# Patient Record
Sex: Male | Born: 1955
Health system: Southern US, Community
[De-identification: ages and names within clinical notes are randomized; demographics above are authoritative.]

## PROBLEM LIST (undated history)

## (undated) DIAGNOSIS — M1990 Unspecified osteoarthritis, unspecified site: Secondary | ICD-10-CM

## (undated) DIAGNOSIS — J189 Pneumonia, unspecified organism: Secondary | ICD-10-CM

## (undated) DIAGNOSIS — S3991XA Unspecified injury of abdomen, initial encounter: Secondary | ICD-10-CM

## (undated) DIAGNOSIS — E785 Hyperlipidemia, unspecified: Secondary | ICD-10-CM

## (undated) DIAGNOSIS — I10 Essential (primary) hypertension: Secondary | ICD-10-CM

## (undated) DIAGNOSIS — D126 Benign neoplasm of colon, unspecified: Secondary | ICD-10-CM

## (undated) DIAGNOSIS — F329 Major depressive disorder, single episode, unspecified: Secondary | ICD-10-CM

## (undated) DIAGNOSIS — F1410 Cocaine abuse, uncomplicated: Secondary | ICD-10-CM

## (undated) DIAGNOSIS — F32A Depression, unspecified: Secondary | ICD-10-CM

## (undated) HISTORY — DX: Benign neoplasm of colon, unspecified: D12.6

## (undated) HISTORY — DX: Hyperlipidemia, unspecified: E78.5

## (undated) HISTORY — DX: Essential (primary) hypertension: I10

## (undated) HISTORY — PX: BACK SURGERY: SHX140

---

## 2004-03-13 ENCOUNTER — Emergency Department (HOSPITAL_COMMUNITY): Admission: EM | Admit: 2004-03-13 | Discharge: 2004-03-13 | Payer: Self-pay | Admitting: Emergency Medicine

## 2004-03-14 ENCOUNTER — Emergency Department (HOSPITAL_COMMUNITY): Admission: EM | Admit: 2004-03-14 | Discharge: 2004-03-14 | Payer: Self-pay | Admitting: Emergency Medicine

## 2004-05-15 ENCOUNTER — Emergency Department (HOSPITAL_COMMUNITY): Admission: EM | Admit: 2004-05-15 | Discharge: 2004-05-15 | Payer: Self-pay | Admitting: Emergency Medicine

## 2005-02-07 ENCOUNTER — Ambulatory Visit: Payer: Self-pay | Admitting: Orthopedic Surgery

## 2005-04-15 ENCOUNTER — Ambulatory Visit: Payer: Self-pay | Admitting: Orthopedic Surgery

## 2005-04-23 ENCOUNTER — Ambulatory Visit (HOSPITAL_COMMUNITY): Admission: RE | Admit: 2005-04-23 | Discharge: 2005-04-23 | Payer: Self-pay | Admitting: Orthopedic Surgery

## 2005-04-23 ENCOUNTER — Encounter: Payer: Self-pay | Admitting: Orthopedic Surgery

## 2005-04-23 ENCOUNTER — Ambulatory Visit: Payer: Self-pay | Admitting: Orthopedic Surgery

## 2005-04-29 ENCOUNTER — Ambulatory Visit: Payer: Self-pay | Admitting: Orthopedic Surgery

## 2005-05-08 ENCOUNTER — Ambulatory Visit: Payer: Self-pay | Admitting: Orthopedic Surgery

## 2005-05-09 ENCOUNTER — Emergency Department (HOSPITAL_COMMUNITY): Admission: EM | Admit: 2005-05-09 | Discharge: 2005-05-10 | Payer: Self-pay | Admitting: Emergency Medicine

## 2005-05-11 ENCOUNTER — Emergency Department (HOSPITAL_COMMUNITY): Admission: EM | Admit: 2005-05-11 | Discharge: 2005-05-11 | Payer: Self-pay | Admitting: Emergency Medicine

## 2005-05-15 ENCOUNTER — Encounter (HOSPITAL_COMMUNITY): Admission: RE | Admit: 2005-05-15 | Discharge: 2005-06-14 | Payer: Self-pay | Admitting: Orthopedic Surgery

## 2005-05-15 ENCOUNTER — Ambulatory Visit: Payer: Self-pay | Admitting: Orthopedic Surgery

## 2005-05-29 ENCOUNTER — Ambulatory Visit: Payer: Self-pay | Admitting: Orthopedic Surgery

## 2005-08-14 ENCOUNTER — Emergency Department (HOSPITAL_COMMUNITY): Admission: EM | Admit: 2005-08-14 | Discharge: 2005-08-14 | Payer: Self-pay | Admitting: Emergency Medicine

## 2005-10-19 ENCOUNTER — Emergency Department (HOSPITAL_COMMUNITY): Admission: EM | Admit: 2005-10-19 | Discharge: 2005-10-20 | Payer: Self-pay | Admitting: Emergency Medicine

## 2006-05-19 ENCOUNTER — Ambulatory Visit: Payer: Self-pay | Admitting: Orthopedic Surgery

## 2006-10-04 ENCOUNTER — Emergency Department (HOSPITAL_COMMUNITY): Admission: EM | Admit: 2006-10-04 | Discharge: 2006-10-04 | Payer: Self-pay | Admitting: Emergency Medicine

## 2008-02-11 ENCOUNTER — Emergency Department (HOSPITAL_COMMUNITY): Admission: EM | Admit: 2008-02-11 | Discharge: 2008-02-11 | Payer: Self-pay | Admitting: Emergency Medicine

## 2009-02-18 ENCOUNTER — Emergency Department (HOSPITAL_COMMUNITY): Admission: EM | Admit: 2009-02-18 | Discharge: 2009-02-18 | Payer: Self-pay | Admitting: Emergency Medicine

## 2009-06-12 ENCOUNTER — Emergency Department (HOSPITAL_COMMUNITY): Admission: EM | Admit: 2009-06-12 | Discharge: 2009-06-13 | Payer: Self-pay | Admitting: Emergency Medicine

## 2009-11-08 ENCOUNTER — Emergency Department (HOSPITAL_COMMUNITY): Admission: EM | Admit: 2009-11-08 | Discharge: 2009-11-08 | Payer: Self-pay | Admitting: Emergency Medicine

## 2010-02-20 ENCOUNTER — Inpatient Hospital Stay (HOSPITAL_COMMUNITY): Admission: EM | Admit: 2010-02-20 | Discharge: 2010-02-21 | Payer: Self-pay | Admitting: Emergency Medicine

## 2010-06-27 ENCOUNTER — Emergency Department (HOSPITAL_COMMUNITY)
Admission: EM | Admit: 2010-06-27 | Discharge: 2010-06-27 | Payer: Self-pay | Source: Home / Self Care | Admitting: Emergency Medicine

## 2010-06-27 ENCOUNTER — Ambulatory Visit: Payer: Self-pay | Admitting: Psychiatry

## 2010-06-27 ENCOUNTER — Inpatient Hospital Stay (HOSPITAL_COMMUNITY): Admission: EM | Admit: 2010-06-27 | Discharge: 2010-07-02 | Payer: Self-pay | Admitting: Psychiatry

## 2010-07-07 ENCOUNTER — Emergency Department (HOSPITAL_COMMUNITY): Admission: EM | Admit: 2010-07-07 | Discharge: 2010-07-07 | Payer: Self-pay | Admitting: Emergency Medicine

## 2010-12-08 ENCOUNTER — Emergency Department (HOSPITAL_COMMUNITY)
Admission: EM | Admit: 2010-12-08 | Discharge: 2010-12-08 | Disposition: A | Discharge status: Home / Self Care | Payer: Self-pay | Attending: Emergency Medicine | Admitting: Emergency Medicine

## 2010-12-08 DIAGNOSIS — X58XXXA Exposure to other specified factors, initial encounter: Secondary | ICD-10-CM | POA: Insufficient documentation

## 2010-12-08 DIAGNOSIS — S60459A Superficial foreign body of unspecified finger, initial encounter: Secondary | ICD-10-CM | POA: Insufficient documentation

## 2010-12-08 DIAGNOSIS — Y92009 Unspecified place in unspecified non-institutional (private) residence as the place of occurrence of the external cause: Secondary | ICD-10-CM | POA: Insufficient documentation

## 2010-12-20 LAB — CBC
HCT: 45.7 % (ref 39.0–52.0)
Hemoglobin: 15.4 g/dL (ref 13.0–17.0)
MCH: 30.7 pg (ref 26.0–34.0)
MCHC: 33.7 g/dL (ref 30.0–36.0)
MCV: 90.9 fL (ref 78.0–100.0)
Platelets: 244 10*3/uL (ref 150–400)
RBC: 5.02 MIL/uL (ref 4.22–5.81)
RDW: 13 % (ref 11.5–15.5)
WBC: 11.3 10*3/uL — ABNORMAL HIGH (ref 4.0–10.5)

## 2010-12-20 LAB — COMPREHENSIVE METABOLIC PANEL
ALT: 17 U/L (ref 0–53)
AST: 21 U/L (ref 0–37)
Albumin: 4.4 g/dL (ref 3.5–5.2)
Alkaline Phosphatase: 44 U/L (ref 39–117)
BUN: 8 mg/dL (ref 6–23)
CO2: 25 mEq/L (ref 19–32)
Calcium: 9.4 mg/dL (ref 8.4–10.5)
Chloride: 100 mEq/L (ref 96–112)
Creatinine, Ser: 0.78 mg/dL (ref 0.4–1.5)
GFR calc Af Amer: >60 mL/min (ref >60)
GFR calc non Af Amer: >60 mL/min (ref >60)
Glucose, Bld: 114 mg/dL — ABNORMAL HIGH (ref 70–99)
Potassium: 3.2 mEq/L — ABNORMAL LOW (ref 3.5–5.1)
Sodium: 134 mEq/L — ABNORMAL LOW (ref 135–145)
Total Bilirubin: 0.6 mg/dL (ref 0.3–1.2)
Total Protein: 8.1 g/dL (ref 6.0–8.3)

## 2010-12-20 LAB — DIFFERENTIAL
Basophils Absolute: 0 10*3/uL (ref 0.0–0.1)
Basophils Relative: 0 % (ref 0–1)
Eosinophils Absolute: 0.2 10*3/uL (ref 0.0–0.7)
Eosinophils Relative: 2 % (ref 0–5)
Lymphocytes Relative: 22 % (ref 12–46)
Lymphs Abs: 2.4 10*3/uL (ref 0.7–4.0)
Monocytes Absolute: 0.6 10*3/uL (ref 0.1–1.0)
Monocytes Relative: 6 % (ref 3–12)
Neutro Abs: 7.9 10*3/uL — ABNORMAL HIGH (ref 1.7–7.7)
Neutrophils Relative %: 71 % (ref 43–77)

## 2010-12-20 LAB — RAPID URINE DRUG SCREEN, HOSP PERFORMED
Amphetamines: NOT DETECTED
Barbiturates: NOT DETECTED
Benzodiazepines: NOT DETECTED
Cocaine: NOT DETECTED
Opiates: NOT DETECTED
Tetrahydrocannabinol: POSITIVE — AB

## 2010-12-20 LAB — ETHANOL: Alcohol, Ethyl (B): <5 mg/dL (ref 0–10)

## 2010-12-24 LAB — RAPID URINE DRUG SCREEN, HOSP PERFORMED
Amphetamines: NOT DETECTED
Barbiturates: NOT DETECTED
Benzodiazepines: POSITIVE — AB
Cocaine: NOT DETECTED
Opiates: POSITIVE — AB
Tetrahydrocannabinol: POSITIVE — AB

## 2010-12-24 LAB — BASIC METABOLIC PANEL
BUN: 2 mg/dL — ABNORMAL LOW (ref 6–23)
BUN: 3 mg/dL — ABNORMAL LOW (ref 6–23)
CO2: 26 mEq/L (ref 19–32)
CO2: 26 mEq/L (ref 19–32)
Calcium: 8.7 mg/dL (ref 8.4–10.5)
Calcium: 9.1 mg/dL (ref 8.4–10.5)
Chloride: 105 mEq/L (ref 96–112)
Chloride: 107 mEq/L (ref 96–112)
Creatinine, Ser: 0.79 mg/dL (ref 0.4–1.5)
Creatinine, Ser: 0.84 mg/dL (ref 0.4–1.5)
GFR calc Af Amer: >60 mL/min (ref >60)
GFR calc Af Amer: >60 mL/min (ref >60)
GFR calc non Af Amer: >60 mL/min (ref >60)
Glucose, Bld: 73 mg/dL (ref 70–99)
Potassium: 3 mEq/L — ABNORMAL LOW (ref 3.5–5.1)
Sodium: 139 mEq/L (ref 135–145)

## 2010-12-24 LAB — DIFFERENTIAL
Basophils Absolute: 0.1 10*3/uL (ref 0.0–0.1)
Basophils Absolute: 0.1 10*3/uL (ref 0.0–0.1)
Basophils Relative: 1 % (ref 0–1)
Basophils Relative: 1 % (ref 0–1)
Eosinophils Absolute: 0.6 10*3/uL (ref 0.0–0.7)
Eosinophils Absolute: 0.7 10*3/uL (ref 0.0–0.7)
Eosinophils Relative: 7 % — ABNORMAL HIGH (ref 0–5)
Lymphocytes Relative: 36 % (ref 12–46)
Lymphs Abs: 3.6 10*3/uL (ref 0.7–4.0)
Monocytes Absolute: 0.5 10*3/uL (ref 0.1–1.0)
Monocytes Relative: 5 % (ref 3–12)
Neutro Abs: 2.9 10*3/uL (ref 1.7–7.7)
Neutro Abs: 5.1 10*3/uL (ref 1.7–7.7)
Neutrophils Relative %: 38 % — ABNORMAL LOW (ref 43–77)
Neutrophils Relative %: 50 % (ref 43–77)

## 2010-12-24 LAB — CBC
HCT: 40.5 % (ref 39.0–52.0)
Hemoglobin: 14.3 g/dL (ref 13.0–17.0)
MCHC: 35.2 g/dL (ref 30.0–36.0)
MCHC: 35.4 g/dL (ref 30.0–36.0)
MCV: 87.4 fL (ref 78.0–100.0)
MCV: 87.6 fL (ref 78.0–100.0)
Platelets: 175 10*3/uL (ref 150–400)
Platelets: 179 10*3/uL (ref 150–400)
RBC: 4.62 MIL/uL (ref 4.22–5.81)
RDW: 13.5 % (ref 11.5–15.5)
RDW: 13.8 % (ref 11.5–15.5)
WBC: 10.1 10*3/uL (ref 4.0–10.5)

## 2010-12-24 LAB — SALICYLATE LEVEL: Salicylate Lvl: <4 mg/dL (ref 2.8–20.0)

## 2010-12-24 LAB — ETHANOL: Alcohol, Ethyl (B): <5 mg/dL (ref 0–10)

## 2010-12-24 LAB — ACETAMINOPHEN LEVEL: Acetaminophen (Tylenol), Serum: <10 ug/mL — ABNORMAL LOW (ref 10–30)

## 2010-12-24 LAB — BRAIN NATRIURETIC PEPTIDE: Pro B Natriuretic peptide (BNP): <30 pg/mL (ref 0.0–100.0)

## 2010-12-26 LAB — RAPID STREP SCREEN (MED CTR MEBANE ONLY): Streptococcus, Group A Screen (Direct): NEGATIVE

## 2011-01-11 LAB — DIFFERENTIAL
Basophils Absolute: 0.1 10*3/uL (ref 0.0–0.1)
Eosinophils Relative: 5 % (ref 0–5)
Lymphocytes Relative: 38 % (ref 12–46)
Neutrophils Relative %: 51 % (ref 43–77)

## 2011-01-11 LAB — POCT I-STAT, CHEM 8
BUN: 4 mg/dL — ABNORMAL LOW (ref 6–23)
Hemoglobin: 15.3 g/dL (ref 13.0–17.0)
Potassium: 3.7 mEq/L (ref 3.5–5.1)
Sodium: 139 mEq/L (ref 135–145)
TCO2: 26 mmol/L (ref 0–100)

## 2011-01-11 LAB — URINALYSIS, ROUTINE W REFLEX MICROSCOPIC
Glucose, UA: NEGATIVE mg/dL
Ketones, ur: NEGATIVE mg/dL
Nitrite: NEGATIVE
Protein, ur: NEGATIVE mg/dL
Urobilinogen, UA: 1 mg/dL (ref 0.0–1.0)

## 2011-01-11 LAB — CBC
HCT: 41.8 % (ref 39.0–52.0)
Platelets: 198 10*3/uL (ref 150–400)
RDW: 13 % (ref 11.5–15.5)

## 2011-02-22 NOTE — Op Note (Signed)
NAME:  [name], [name] NO.:  [id number]   MEDICAL RECORD NO.:  [id number]          PATIENT TYPE:  AMB   LOCATION:  DAY                           FACILITY:  [facility]   PHYSICIAN:  Vickki Hearing, M.D.DATE OF BIRTH:  05-Nov-1955   DATE OF PROCEDURE:  04/23/2005  DATE OF DISCHARGE:                                 OPERATIVE REPORT   PREOPERATIVE DIAGNOSIS:  Mass and bursitis right elbow.   POSTOPERATIVE DIAGNOSIS:  Right elbow bursitis.   PROCEDURE:  Bursectomy right elbow.   SURGEON:  Vickki Hearing, M.D.   ASSISTANT:  None.   ANESTHETIC:  General by LMA.   SPECIMENS:  Bursa.   BLOOD LOSS:  Minimal.   COMPLICATIONS:  None.   INDICATIONS:  Persistent pain and swelling over the right elbow.   HISTORY:  A 55 year old male status post aspiration of right elbow bursitis,  recurred, became more painful and presented for excision.   The patient was identified as Gary [name].  Right elbow was marked for  surgery, countersigned by the surgeon. History and physical was updated. He  was given Ancef IV, taken to the operating room for general LMA anesthetic.  After adequate anesthesia, his right arm was prepped and draped using  sterile technique with a tourniquet applied to the right upper extremity.  Time-out was taken and completed as required.  The limb was exsanguinated  with a 6-inch Esmarch.  Tourniquet was elevated 250 mmHg where it stayed for  20 minutes.   A curvilinear incision was made over the bursal tissue. The sac was removed  with blunt and sharp dissection. The wound was irrigated, closed the 2-0  Monocryl and staples. We injected 10 mL of 0.5% plain Marcaine, we applied a  sterile dressing, extubated the patient, took him to the recovery room in  stable condition. He will actually follow-up next week for staple removal.  He is discharged on Lorcet Plus for pain.       SEH/MEDQ  D:  04/23/2005  T:  04/23/2005  Job:  [id number]

## 2011-02-22 NOTE — H&P (Signed)
NAME:  GALDINO, HINCHMAN NO.:  [id number]   MEDICAL RECORD NO.:  [id number]          PATIENT TYPE:  AMB   LOCATION:  DAY                           FACILITY:  [facility]   PHYSICIAN:  Vickki Hearing, M.D.DATE OF BIRTH:  1955/12/28   DATE OF ADMISSION:  DATE OF DISCHARGE:  LH                                HISTORY & PHYSICAL   CHIEF COMPLAINT:  Mass, right elbow.   HISTORY OF PRESENT ILLNESS:  This is a 55 year old male with 3 months'  duration of aching pain in his right elbow associated with a development of  a mass.  He describes moderate aching with no history of trauma.  Pain is  constant.  No other associated signs or symptoms.  Modifying factors include  increase pain with motion and working.   REVIEW OF SYSTEMS:  All 10 systems reviewed, according to the patient, were  normal.   MEDICATIONS:  None.   ALLERGIES:  No known drug allergies.   PAST MEDICAL HISTORY:  None.   PAST SURGICAL HISTORY:  Previous back surgery.  Pharmacy [facility].   FAMILY HISTORY:  Heart disease, kidney disease, arthritis.   SOCIAL HISTORY:  He is married.  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  He reports a smoking history.  No alcohol.  Drinks a lot of coffee and tea.   PHYSICAL EXAMINATION:  VITAL SIGNS:  Weight 180, pulse 70, respirations 16.  GENERAL:  Appearance well-developed, well-nourished.  Hygiene and grooming  normal.  No deformities.  Body habitus ectomorphic.  CARDIAC:  Normal.  LYMPHS:  Negative in the epitrochlear region.  MUSCULOSKELETAL:  Normal gait and station.  Right elbow had normal range of  motion.  Muscle strength and tone were normal there.  There was no  instability.  He had a large, right elbow bursal sac which was tender.  It  was not red or showed signs of infection.  His skin was normal over the  area.  NEUROLOGIC:  Normal sensation.  Mood was good.  He was alert and oriented  x3.   LABORATORY DATA AND X-RAY  FINDINGS:  Radiographs included two views of his  right elbow.  They were normal.  We aspirated and injected it back in May.  It returned.  He wished to have it excised and we will do that.   PLAN:  Plan is for right elbow bursectomy for right elbow bursitis.       SEH/MEDQ  D:  04/22/2005  T:  04/22/2005  Job:  [id number]

## 2011-04-07 HISTORY — PX: STOMACH SURGERY: SHX791

## 2011-04-30 ENCOUNTER — Emergency Department (HOSPITAL_COMMUNITY): Payer: Medicaid Other

## 2011-04-30 ENCOUNTER — Inpatient Hospital Stay (HOSPITAL_COMMUNITY)
Admission: EM | Admit: 2011-04-30 | Discharge: 2011-05-16 | DRG: 957 | Disposition: A | Discharge status: Other Acute Inpatient Hospital | Payer: Medicaid Other | Source: Ambulatory Visit | Attending: General Surgery | Admitting: General Surgery

## 2011-04-30 DIAGNOSIS — J189 Pneumonia, unspecified organism: Secondary | ICD-10-CM | POA: Diagnosis not present

## 2011-04-30 DIAGNOSIS — K560 Paralytic ileus: Secondary | ICD-10-CM | POA: Diagnosis not present

## 2011-04-30 DIAGNOSIS — S36899A Unspecified injury of other intra-abdominal organs, initial encounter: Secondary | ICD-10-CM | POA: Diagnosis present

## 2011-04-30 DIAGNOSIS — F329 Major depressive disorder, single episode, unspecified: Secondary | ICD-10-CM | POA: Diagnosis present

## 2011-04-30 DIAGNOSIS — F1020 Alcohol dependence, uncomplicated: Secondary | ICD-10-CM | POA: Diagnosis present

## 2011-04-30 DIAGNOSIS — S3630XA Unspecified injury of stomach, initial encounter: Principal | ICD-10-CM | POA: Diagnosis present

## 2011-04-30 DIAGNOSIS — F17200 Nicotine dependence, unspecified, uncomplicated: Secondary | ICD-10-CM | POA: Diagnosis present

## 2011-04-30 DIAGNOSIS — S37009A Unspecified injury of unspecified kidney, initial encounter: Secondary | ICD-10-CM | POA: Diagnosis present

## 2011-04-30 DIAGNOSIS — S27809A Unspecified injury of diaphragm, initial encounter: Secondary | ICD-10-CM | POA: Diagnosis present

## 2011-04-30 DIAGNOSIS — F3289 Other specified depressive episodes: Secondary | ICD-10-CM | POA: Diagnosis present

## 2011-04-30 DIAGNOSIS — F10939 Alcohol use, unspecified with withdrawal, unspecified: Secondary | ICD-10-CM | POA: Diagnosis not present

## 2011-04-30 DIAGNOSIS — D62 Acute posthemorrhagic anemia: Secondary | ICD-10-CM | POA: Diagnosis not present

## 2011-04-30 DIAGNOSIS — F10239 Alcohol dependence with withdrawal, unspecified: Secondary | ICD-10-CM | POA: Diagnosis not present

## 2011-04-30 DIAGNOSIS — S36209A Unspecified injury of unspecified part of pancreas, initial encounter: Secondary | ICD-10-CM | POA: Diagnosis present

## 2011-04-30 DIAGNOSIS — B9689 Other specified bacterial agents as the cause of diseases classified elsewhere: Secondary | ICD-10-CM | POA: Diagnosis present

## 2011-04-30 DIAGNOSIS — X749XXA Intentional self-harm by unspecified firearm discharge, initial encounter: Secondary | ICD-10-CM | POA: Diagnosis present

## 2011-04-30 DIAGNOSIS — S36113A Laceration of liver, unspecified degree, initial encounter: Secondary | ICD-10-CM | POA: Diagnosis present

## 2011-04-30 DIAGNOSIS — S36119A Unspecified injury of liver, initial encounter: Secondary | ICD-10-CM

## 2011-04-30 DIAGNOSIS — E876 Hypokalemia: Secondary | ICD-10-CM | POA: Diagnosis not present

## 2011-04-30 LAB — BASIC METABOLIC PANEL
BUN: 10 mg/dL (ref 6–23)
CO2: 23 mEq/L (ref 19–32)
Calcium: 7.7 mg/dL — ABNORMAL LOW (ref 8.4–10.5)
GFR calc non Af Amer: >60 mL/min (ref >60)
Glucose, Bld: 208 mg/dL — ABNORMAL HIGH (ref 70–99)
Sodium: 134 mEq/L — ABNORMAL LOW (ref 135–145)

## 2011-04-30 LAB — POCT I-STAT, CHEM 8
Creatinine, Ser: 1 mg/dL (ref 0.50–1.35)
HCT: 41 % (ref 39.0–52.0)
Hemoglobin: 13.9 g/dL (ref 13.0–17.0)
Potassium: 3.3 meq/L — ABNORMAL LOW (ref 3.5–5.1)
Sodium: 135 meq/L (ref 135–145)
TCO2: 20 mmol/L (ref 0–100)

## 2011-04-30 LAB — ABO/RH: ABO/RH(D): A NEG

## 2011-04-30 LAB — PROTIME-INR
INR: 1.06 (ref 0.00–1.49)
Prothrombin Time: 14 s (ref 11.6–15.2)

## 2011-04-30 LAB — COMPREHENSIVE METABOLIC PANEL
ALT: 62 U/L — ABNORMAL HIGH (ref 0–53)
AST: 62 U/L — ABNORMAL HIGH (ref 0–37)
Albumin: 3.2 g/dL — ABNORMAL LOW (ref 3.5–5.2)
Calcium: 8.8 mg/dL (ref 8.4–10.5)
Creatinine, Ser: 1 mg/dL (ref 0.50–1.35)
GFR calc non Af Amer: >60 mL/min (ref >60)
Sodium: 136 meq/L (ref 135–145)
Total Protein: 6.7 g/dL (ref 6.0–8.3)

## 2011-04-30 LAB — MRSA PCR SCREENING: MRSA by PCR: NEGATIVE

## 2011-04-30 LAB — CBC
HCT: 34.4 % — ABNORMAL LOW (ref 39.0–52.0)
Hemoglobin: 13.8 g/dL (ref 13.0–17.0)
Hemoglobin: 11.9 g/dL — ABNORMAL LOW (ref 13.0–17.0)
MCH: 30.5 pg (ref 26.0–34.0)
MCH: 30.3 pg (ref 26.0–34.0)
MCHC: 34.6 g/dL (ref 30.0–36.0)
Platelets: 233 10*3/uL (ref 150–400)
RBC: 4.52 MIL/uL (ref 4.22–5.81)
RBC: 3.93 MIL/uL — ABNORMAL LOW (ref 4.22–5.81)
WBC: 16.9 10*3/uL — ABNORMAL HIGH (ref 4.0–10.5)

## 2011-04-30 LAB — PHOSPHORUS: Phosphorus: 3 mg/dL (ref 2.3–4.6)

## 2011-04-30 LAB — LACTIC ACID, PLASMA: Lactic Acid, Venous: 3.6 mmol/L — ABNORMAL HIGH (ref 0.5–2.2)

## 2011-05-01 ENCOUNTER — Inpatient Hospital Stay (HOSPITAL_COMMUNITY): Payer: Medicaid Other

## 2011-05-01 LAB — BASIC METABOLIC PANEL
CO2: 25 mEq/L (ref 19–32)
Calcium: 7.9 mg/dL — ABNORMAL LOW (ref 8.4–10.5)
Chloride: 100 mEq/L (ref 96–112)
GFR calc non Af Amer: >60 mL/min (ref >60)
Potassium: 4.5 mEq/L (ref 3.5–5.1)

## 2011-05-01 LAB — CBC
MCH: 29.4 pg (ref 26.0–34.0)
MCHC: 33.6 g/dL (ref 30.0–36.0)
Platelets: 171 10*3/uL (ref 150–400)
RBC: 3.84 MIL/uL — ABNORMAL LOW (ref 4.22–5.81)
RDW: 13.3 % (ref 11.5–15.5)

## 2011-05-01 LAB — POCT I-STAT 7, (LYTES, BLD GAS, ICA,H+H)
Calcium, Ion: 1.08 mmol/L — ABNORMAL LOW (ref 1.12–1.32)
HCT: 34 % — ABNORMAL LOW (ref 39.0–52.0)
Hemoglobin: 11.6 g/dL — ABNORMAL LOW (ref 13.0–17.0)
Potassium: 3.9 meq/L (ref 3.5–5.1)
Sodium: 136 meq/L (ref 135–145)
pH, Arterial: 7.361 (ref 7.350–7.450)

## 2011-05-01 MED ORDER — IOHEXOL 300 MG/ML  SOLN
80.0000 mL | Freq: Once | INTRAMUSCULAR | Status: AC | PRN
  Administered 2011-05-01: 80 mL via INTRAVENOUS

## 2011-05-02 DIAGNOSIS — R17 Unspecified jaundice: Secondary | ICD-10-CM

## 2011-05-02 LAB — CBC
HCT: 29.5 % — ABNORMAL LOW (ref 39.0–52.0)
MCH: 29.7 pg (ref 26.0–34.0)
MCV: 86.8 fL (ref 78.0–100.0)
Platelets: 154 10*3/uL (ref 150–400)
RDW: 13.6 % (ref 11.5–15.5)

## 2011-05-03 DIAGNOSIS — F1994 Other psychoactive substance use, unspecified with psychoactive substance-induced mood disorder: Secondary | ICD-10-CM

## 2011-05-03 LAB — CBC
MCH: 29.6 pg (ref 26.0–34.0)
MCHC: 33.9 g/dL (ref 30.0–36.0)
RDW: 13.9 % (ref 11.5–15.5)

## 2011-05-03 LAB — BASIC METABOLIC PANEL
BUN: 5 mg/dL — ABNORMAL LOW (ref 6–23)
Calcium: 8.6 mg/dL (ref 8.4–10.5)
Creatinine, Ser: 0.57 mg/dL (ref 0.50–1.35)
GFR calc Af Amer: >60 mL/min (ref >60)

## 2011-05-03 LAB — HEPATIC FUNCTION PANEL
Albumin: 2.5 g/dL — ABNORMAL LOW (ref 3.5–5.2)
Indirect Bilirubin: 1.1 mg/dL — ABNORMAL HIGH (ref 0.3–0.9)
Total Bilirubin: 3.7 mg/dL — ABNORMAL HIGH (ref 0.3–1.2)
Total Protein: 6.1 g/dL (ref 6.0–8.3)

## 2011-05-04 LAB — CBC
HCT: 26.1 % — ABNORMAL LOW (ref 39.0–52.0)
MCHC: 34.5 g/dL (ref 30.0–36.0)
Platelets: 222 10*3/uL (ref 150–400)
RDW: 14 % (ref 11.5–15.5)
WBC: 10.3 10*3/uL (ref 4.0–10.5)

## 2011-05-04 LAB — TYPE AND SCREEN
ABO/RH(D): A NEG
Antibody Screen: NEGATIVE
Unit division: 0
Unit division: 0
Unit division: 0
Unit division: 0

## 2011-05-04 LAB — COMPREHENSIVE METABOLIC PANEL
AST: 24 U/L (ref 0–37)
Albumin: 2.4 g/dL — ABNORMAL LOW (ref 3.5–5.2)
Alkaline Phosphatase: 132 U/L — ABNORMAL HIGH (ref 39–117)
BUN: 7 mg/dL (ref 6–23)
Chloride: 99 mEq/L (ref 96–112)
Creatinine, Ser: 0.53 mg/dL (ref 0.50–1.35)
Potassium: 3.5 mEq/L (ref 3.5–5.1)
Total Bilirubin: 1.7 mg/dL — ABNORMAL HIGH (ref 0.3–1.2)
Total Protein: 6.2 g/dL (ref 6.0–8.3)

## 2011-05-05 LAB — CBC
HCT: 27.3 % — ABNORMAL LOW (ref 39.0–52.0)
MCH: 29.4 pg (ref 26.0–34.0)
MCV: 86.4 fL (ref 78.0–100.0)
Platelets: 260 10*3/uL (ref 150–400)
RBC: 3.16 MIL/uL — ABNORMAL LOW (ref 4.22–5.81)
WBC: 6.6 10*3/uL (ref 4.0–10.5)

## 2011-05-05 LAB — BASIC METABOLIC PANEL
BUN: 8 mg/dL (ref 6–23)
CO2: 26 mEq/L (ref 19–32)
Calcium: 8.8 mg/dL (ref 8.4–10.5)
Chloride: 98 mEq/L (ref 96–112)
Creatinine, Ser: 0.59 mg/dL (ref 0.50–1.35)
Glucose, Bld: 111 mg/dL — ABNORMAL HIGH (ref 70–99)
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
## 2011-05-06 DIAGNOSIS — D62 Acute posthemorrhagic anemia: Secondary | ICD-10-CM

## 2011-05-07 ENCOUNTER — Inpatient Hospital Stay (HOSPITAL_COMMUNITY): Payer: Medicaid Other

## 2011-05-07 DIAGNOSIS — J9600 Acute respiratory failure, unspecified whether with hypoxia or hypercapnia: Secondary | ICD-10-CM

## 2011-05-07 LAB — URINE MICROSCOPIC-ADD ON

## 2011-05-07 LAB — COMPREHENSIVE METABOLIC PANEL
ALT: 78 U/L — ABNORMAL HIGH (ref 0–53)
BUN: 6 mg/dL (ref 6–23)
CO2: 27 mEq/L (ref 19–32)
Calcium: 8.4 mg/dL (ref 8.4–10.5)
Creatinine, Ser: 0.48 mg/dL — ABNORMAL LOW (ref 0.50–1.35)
GFR calc Af Amer: >60 mL/min (ref >60)
GFR calc non Af Amer: >60 mL/min (ref >60)
Glucose, Bld: 98 mg/dL (ref 70–99)
Sodium: 137 mEq/L (ref 135–145)
Total Protein: 6.6 g/dL (ref 6.0–8.3)

## 2011-05-07 LAB — CBC
HCT: 26 % — ABNORMAL LOW (ref 39.0–52.0)
Hemoglobin: 8.9 g/dL — ABNORMAL LOW (ref 13.0–17.0)
MCH: 30 pg (ref 26.0–34.0)
MCHC: 34.2 g/dL (ref 30.0–36.0)
MCV: 87.5 fL (ref 78.0–100.0)
RBC: 2.97 MIL/uL — ABNORMAL LOW (ref 4.22–5.81)

## 2011-05-07 LAB — URINALYSIS, ROUTINE W REFLEX MICROSCOPIC
Bilirubin Urine: NEGATIVE
Ketones, ur: NEGATIVE mg/dL
Specific Gravity, Urine: 1.015 (ref 1.005–1.030)
Urobilinogen, UA: 0.2 mg/dL (ref 0.0–1.0)

## 2011-05-07 LAB — GLUCOSE, CAPILLARY: Glucose-Capillary: 93 mg/dL (ref 70–99)

## 2011-05-07 MED ORDER — SODIUM CHLORIDE 0.9 % IJ SOLN
INTRAMUSCULAR | Status: AC
  Filled 2011-05-07: qty 20

## 2011-05-08 ENCOUNTER — Inpatient Hospital Stay (HOSPITAL_COMMUNITY): Payer: Medicaid Other

## 2011-05-08 LAB — CBC
Hemoglobin: 8.4 g/dL — ABNORMAL LOW (ref 13.0–17.0)
MCV: 88.8 fL (ref 78.0–100.0)
Platelets: 385 10*3/uL (ref 150–400)
RBC: 2.78 MIL/uL — ABNORMAL LOW (ref 4.22–5.81)
WBC: 12.9 10*3/uL — ABNORMAL HIGH (ref 4.0–10.5)

## 2011-05-08 LAB — URINE CULTURE
Colony Count: NO GROWTH
Culture  Setup Time: 201207311900
Culture: NO GROWTH
Special Requests: NEGATIVE

## 2011-05-08 LAB — BASIC METABOLIC PANEL
CO2: 27 mEq/L (ref 19–32)
Chloride: 105 mEq/L (ref 96–112)
Sodium: 138 mEq/L (ref 135–145)

## 2011-05-08 LAB — POCT I-STAT 3, ART BLOOD GAS (G3+)
Bicarbonate: 27.7 meq/L — ABNORMAL HIGH (ref 20.0–24.0)
TCO2: 29 mmol/L (ref 0–100)
pCO2 arterial: 47.7 mmHg — ABNORMAL HIGH (ref 35.0–45.0)
pH, Arterial: 7.378 (ref 7.350–7.450)

## 2011-05-09 ENCOUNTER — Inpatient Hospital Stay (HOSPITAL_COMMUNITY): Payer: Medicaid Other

## 2011-05-09 LAB — BASIC METABOLIC PANEL
BUN: 8 mg/dL (ref 6–23)
Creatinine, Ser: 0.6 mg/dL (ref 0.50–1.35)
GFR calc Af Amer: >60 mL/min (ref >60)
GFR calc non Af Amer: >60 mL/min (ref >60)

## 2011-05-09 LAB — DIFFERENTIAL
Basophils Absolute: 0 10*3/uL (ref 0.0–0.1)
Basophils Relative: 0 % (ref 0–1)
Eosinophils Absolute: 0.8 10*3/uL — ABNORMAL HIGH (ref 0.0–0.7)
Monocytes Absolute: 0.8 10*3/uL (ref 0.1–1.0)
Neutro Abs: 8.5 10*3/uL — ABNORMAL HIGH (ref 1.7–7.7)
Neutrophils Relative %: 69 % (ref 43–77)

## 2011-05-09 LAB — CBC
HCT: 22.4 % — ABNORMAL LOW (ref 39.0–52.0)
MCHC: 33 g/dL (ref 30.0–36.0)
MCV: 88.2 fL (ref 78.0–100.0)
RDW: 13.7 % (ref 11.5–15.5)

## 2011-05-09 LAB — BLOOD GAS, ARTERIAL
Bicarbonate: 25.2 mEq/L — ABNORMAL HIGH (ref 20.0–24.0)
PEEP: 5 cmH2O
Patient temperature: 98.6
pH, Arterial: 7.454 — ABNORMAL HIGH (ref 7.350–7.450)
pO2, Arterial: 113 mmHg — ABNORMAL HIGH (ref 80.0–100.0)

## 2011-05-09 MED ORDER — IOHEXOL 300 MG/ML  SOLN
150.0000 mL | Freq: Once | INTRAMUSCULAR | Status: AC | PRN
  Administered 2011-05-09: 150 mL via ORAL

## 2011-05-10 ENCOUNTER — Inpatient Hospital Stay (HOSPITAL_COMMUNITY): Payer: Medicaid Other

## 2011-05-10 DIAGNOSIS — J189 Pneumonia, unspecified organism: Secondary | ICD-10-CM

## 2011-05-10 DIAGNOSIS — E876 Hypokalemia: Secondary | ICD-10-CM

## 2011-05-10 LAB — CULTURE, BAL-QUANTITATIVE W GRAM STAIN

## 2011-05-10 LAB — COMPREHENSIVE METABOLIC PANEL
AST: 36 U/L (ref 0–37)
Albumin: 2.3 g/dL — ABNORMAL LOW (ref 3.5–5.2)
Chloride: 106 mEq/L (ref 96–112)
Creatinine, Ser: 0.64 mg/dL (ref 0.50–1.35)
Total Bilirubin: 0.8 mg/dL (ref 0.3–1.2)

## 2011-05-10 LAB — CBC
HCT: 25.9 % — ABNORMAL LOW (ref 39.0–52.0)
Hemoglobin: 8.9 g/dL — ABNORMAL LOW (ref 13.0–17.0)
RDW: 13.9 % (ref 11.5–15.5)
WBC: 10.2 10*3/uL (ref 4.0–10.5)

## 2011-05-10 LAB — CROSSMATCH
ABO/RH(D): A NEG
Antibody Screen: NEGATIVE

## 2011-05-10 LAB — AMMONIA: Ammonia: 36 umol/L (ref 11–60)

## 2011-05-10 LAB — PROTIME-INR: INR: 1.4 (ref 0.00–1.49)

## 2011-05-11 LAB — CBC
Hemoglobin: 9.3 g/dL — ABNORMAL LOW (ref 13.0–17.0)
MCH: 29.5 pg (ref 26.0–34.0)
MCV: 87.3 fL (ref 78.0–100.0)
Platelets: 641 10*3/uL — ABNORMAL HIGH (ref 150–400)
RBC: 3.15 MIL/uL — ABNORMAL LOW (ref 4.22–5.81)

## 2011-05-11 LAB — BASIC METABOLIC PANEL
BUN: 5 mg/dL — ABNORMAL LOW (ref 6–23)
CO2: 25 mEq/L (ref 19–32)
Calcium: 8.7 mg/dL (ref 8.4–10.5)
Creatinine, Ser: 1.03 mg/dL (ref 0.50–1.35)
Glucose, Bld: 100 mg/dL — ABNORMAL HIGH (ref 70–99)
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
## 2011-05-14 DIAGNOSIS — F322 Major depressive disorder, single episode, severe without psychotic features: Secondary | ICD-10-CM

## 2011-05-16 ENCOUNTER — Inpatient Hospital Stay (HOSPITAL_COMMUNITY)
Admission: AD | Admit: 2011-05-16 | Discharge: 2011-05-20 | DRG: 896 | Disposition: A | Discharge status: Home / Self Care | Payer: 59 | Source: Ambulatory Visit | Attending: Psychiatry | Admitting: Psychiatry

## 2011-05-16 DIAGNOSIS — F17200 Nicotine dependence, unspecified, uncomplicated: Secondary | ICD-10-CM

## 2011-05-16 DIAGNOSIS — F329 Major depressive disorder, single episode, unspecified: Secondary | ICD-10-CM

## 2011-05-16 DIAGNOSIS — G8929 Other chronic pain: Secondary | ICD-10-CM

## 2011-05-16 DIAGNOSIS — F1320 Sedative, hypnotic or anxiolytic dependence, uncomplicated: Secondary | ICD-10-CM

## 2011-05-16 DIAGNOSIS — F1010 Alcohol abuse, uncomplicated: Secondary | ICD-10-CM

## 2011-05-16 DIAGNOSIS — M545 Low back pain, unspecified: Secondary | ICD-10-CM

## 2011-05-16 DIAGNOSIS — S36899A Unspecified injury of other intra-abdominal organs, initial encounter: Secondary | ICD-10-CM

## 2011-05-16 DIAGNOSIS — F1120 Opioid dependence, uncomplicated: Principal | ICD-10-CM

## 2011-05-16 DIAGNOSIS — X749XXA Intentional self-harm by unspecified firearm discharge, initial encounter: Secondary | ICD-10-CM

## 2011-05-16 DIAGNOSIS — F3289 Other specified depressive episodes: Secondary | ICD-10-CM

## 2011-05-16 DIAGNOSIS — F1994 Other psychoactive substance use, unspecified with psychoactive substance-induced mood disorder: Secondary | ICD-10-CM

## 2011-05-16 DIAGNOSIS — K219 Gastro-esophageal reflux disease without esophagitis: Secondary | ICD-10-CM

## 2011-05-17 DIAGNOSIS — F1910 Other psychoactive substance abuse, uncomplicated: Secondary | ICD-10-CM

## 2011-05-17 LAB — PROTIME-INR
INR: 1.08 (ref 0.00–1.49)
Prothrombin Time: 14.2 seconds (ref 11.6–15.2)
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
## 2011-05-27 ENCOUNTER — Telehealth (INDEPENDENT_AMBULATORY_CARE_PROVIDER_SITE_OTHER): Payer: Self-pay | Admitting: Orthopedic Surgery

## 2011-05-27 NOTE — Telephone Encounter (Signed)
Wife called to get appointment. Explained he already had one this Thursday at 2:00. She was given location.
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
## 2011-05-30 ENCOUNTER — Ambulatory Visit (INDEPENDENT_AMBULATORY_CARE_PROVIDER_SITE_OTHER): Payer: Self-pay | Admitting: Orthopedic Surgery

## 2011-05-30 DIAGNOSIS — S3630XA Unspecified injury of stomach, initial encounter: Secondary | ICD-10-CM

## 2011-05-30 DIAGNOSIS — F329 Major depressive disorder, single episode, unspecified: Secondary | ICD-10-CM | POA: Insufficient documentation

## 2011-05-30 DIAGNOSIS — S21309A Unspecified open wound of unspecified front wall of thorax with penetration into thoracic cavity, initial encounter: Secondary | ICD-10-CM

## 2011-05-30 NOTE — Patient Instructions (Signed)
No lifting more than 5 pounds until 9/10

## 2011-05-30 NOTE — Progress Notes (Signed)
Doing well. No N/V. Eating and elimination normal. Only compaint is dull pain in bilateral lower abdomen when he lays down at night. Lasts until he falls asleep for about 15-20 min with occasional sharp pain radiating to scrotum. Pain is not present when he wakes up.  PE:  Abd: Incision well healed and intact. Flat with normoactive bowel sounds. Mild TTP bilateral lower quadrants. No direct/indirect hernias appreciated.  A/P  S/p ex lap -- I suspect pain is related to stretching of lower abd musculature. This would make sense since it is absent when he wakes up. I reassured him but told him that if it didn't resolve within a month or if it got worse to let us know and we would likely check an x-ray and/or CT. Continue lifting restriction until 9/10.

## 2011-06-13 ENCOUNTER — Telehealth (INDEPENDENT_AMBULATORY_CARE_PROVIDER_SITE_OTHER): Payer: Self-pay | Admitting: Orthopedic Surgery

## 2011-06-13 MED ORDER — RANITIDINE HCL 75 MG PO TABS: 150.0000 mg | ORAL_TABLET | Freq: Every day | ORAL | Status: DC

## 2011-06-13 NOTE — Telephone Encounter (Signed)
Wife called regarding episodes of chest pain and vomiting. It happened this time and once last week. Complains of chest pain every morning. Was going to come to ED but then he started feeling better so didn't. Wife going to try and get in touch with him to have him call us.

## 2011-06-13 NOTE — Telephone Encounter (Signed)
Pt called in to say that he's having chest pain every morning about 4-5AM every morning. Then it gets better during the day. He always has nausea with it, occasionally will have brash in mouth, and twice (once last week and once this morning) has had vomiting. I suggested 150mg  Zantac at bedtime. He is to call Monday if things are not better or go to ED if things get worse.

## 2011-06-13 NOTE — Telephone Encounter (Signed)
Pt tried to call on cell phone that didn't have many minutes. May have run out. She is going to try and figure out a way for him to get in touch with [place].

## 2011-08-07 ENCOUNTER — Encounter: Payer: Self-pay | Admitting: Thoracic Surgery

## 2011-11-14 ENCOUNTER — Encounter (HOSPITAL_COMMUNITY): Payer: Self-pay | Admitting: Emergency Medicine

## 2011-11-14 ENCOUNTER — Encounter (HOSPITAL_COMMUNITY): Payer: Self-pay | Admitting: Anesthesiology

## 2011-11-14 ENCOUNTER — Emergency Department (HOSPITAL_COMMUNITY): Payer: Medicaid Other

## 2011-11-14 ENCOUNTER — Emergency Department (HOSPITAL_COMMUNITY)
Admission: EM | Admit: 2011-11-14 | Discharge: 2011-11-14 | Disposition: A | Discharge status: Home / Self Care | Payer: Medicaid Other | Attending: Emergency Medicine | Admitting: Emergency Medicine

## 2011-11-14 ENCOUNTER — Encounter (HOSPITAL_COMMUNITY)
Admission: EM | Disposition: A | Discharge status: Home / Self Care | Payer: Self-pay | Source: Home / Self Care | Attending: Emergency Medicine

## 2011-11-14 ENCOUNTER — Emergency Department (HOSPITAL_COMMUNITY): Payer: Medicaid Other | Admitting: Anesthesiology

## 2011-11-14 DIAGNOSIS — IMO0002 Reserved for concepts with insufficient information to code with codable children: Secondary | ICD-10-CM | POA: Insufficient documentation

## 2011-11-14 DIAGNOSIS — W298XXA Contact with other powered powered hand tools and household machinery, initial encounter: Secondary | ICD-10-CM | POA: Insufficient documentation

## 2011-11-14 DIAGNOSIS — Y998 Other external cause status: Secondary | ICD-10-CM | POA: Insufficient documentation

## 2011-11-14 DIAGNOSIS — F329 Major depressive disorder, single episode, unspecified: Secondary | ICD-10-CM | POA: Insufficient documentation

## 2011-11-14 DIAGNOSIS — Y92009 Unspecified place in unspecified non-institutional (private) residence as the place of occurrence of the external cause: Secondary | ICD-10-CM | POA: Insufficient documentation

## 2011-11-14 DIAGNOSIS — F17200 Nicotine dependence, unspecified, uncomplicated: Secondary | ICD-10-CM | POA: Insufficient documentation

## 2011-11-14 DIAGNOSIS — F3289 Other specified depressive episodes: Secondary | ICD-10-CM | POA: Insufficient documentation

## 2011-11-14 DIAGNOSIS — S61209A Unspecified open wound of unspecified finger without damage to nail, initial encounter: Secondary | ICD-10-CM | POA: Insufficient documentation

## 2011-11-14 DIAGNOSIS — S68119A Complete traumatic metacarpophalangeal amputation of unspecified finger, initial encounter: Secondary | ICD-10-CM

## 2011-11-14 HISTORY — DX: Unspecified injury of abdomen, initial encounter: S39.91XA

## 2011-11-14 HISTORY — PX: AMPUTATION: SHX166

## 2011-11-14 HISTORY — PX: LACERATION REPAIR: SHX5284

## 2011-11-14 LAB — POCT I-STAT, CHEM 8
Calcium, Ion: 1.18 mmol/L (ref 1.12–1.32)
HCT: 40 % (ref 39.0–52.0)
TCO2: 24 mmol/L (ref 0–100)

## 2011-11-14 LAB — DIFFERENTIAL
Basophils Absolute: 0.1 10*3/uL (ref 0.0–0.1)
Basophils Relative: 0 % (ref 0–1)
Eosinophils Absolute: 0.3 10*3/uL (ref 0.0–0.7)
Eosinophils Relative: 2 % (ref 0–5)
Monocytes Absolute: 0.7 10*3/uL (ref 0.1–1.0)

## 2011-11-14 LAB — CBC
HCT: 38.9 % — ABNORMAL LOW (ref 39.0–52.0)
MCHC: 33.4 g/dL (ref 30.0–36.0)
MCV: 87.4 fL (ref 78.0–100.0)
RDW: 13.2 % (ref 11.5–15.5)

## 2011-11-14 SURGERY — AMPUTATION DIGIT
Anesthesia: General | Laterality: Left | Wound class: Dirty or Infected

## 2011-11-14 MED ORDER — LIDOCAINE HCL 4 % MT SOLN
OROMUCOSAL | Status: DC | PRN
  Administered 2011-11-14: 4 mL via TOPICAL

## 2011-11-14 MED ORDER — HYDROMORPHONE HCL PF 1 MG/ML IJ SOLN
1.0000 mg | INTRAMUSCULAR | Status: DC | PRN
  Administered 2011-11-14: 1 mg via INTRAVENOUS
  Filled 2011-11-14 (×2): qty 1

## 2011-11-14 MED ORDER — CEFAZOLIN SODIUM 1-5 GM-% IV SOLN
INTRAVENOUS | Status: DC | PRN
  Administered 2011-11-14: 1 g via INTRAVENOUS

## 2011-11-14 MED ORDER — LACTATED RINGERS IV SOLN
INTRAVENOUS | Status: DC | PRN
  Administered 2011-11-14 (×2): via INTRAVENOUS

## 2011-11-14 MED ORDER — HYDROMORPHONE HCL PF 1 MG/ML IJ SOLN
1.0000 mg | Freq: Once | INTRAMUSCULAR | Status: AC
  Administered 2011-11-14: 1 mg via INTRAVENOUS

## 2011-11-14 MED ORDER — BUPIVACAINE HCL 0.25 % IJ SOLN
INTRAMUSCULAR | Status: DC | PRN
  Administered 2011-11-14: 10 mL

## 2011-11-14 MED ORDER — SUCCINYLCHOLINE CHLORIDE 20 MG/ML IJ SOLN
INTRAMUSCULAR | Status: DC | PRN
  Administered 2011-11-14: 100 mg via INTRAVENOUS

## 2011-11-14 MED ORDER — BACITRACIN ZINC 500 UNIT/GM EX OINT
TOPICAL_OINTMENT | CUTANEOUS | Status: DC | PRN
  Administered 2011-11-14: 1 via TOPICAL

## 2011-11-14 MED ORDER — CEFAZOLIN SODIUM 1 G IJ SOLR: 1.0000 g | Freq: Once | INTRAMUSCULAR | Status: DC

## 2011-11-14 MED ORDER — PROPOFOL 10 MG/ML IV EMUL
INTRAVENOUS | Status: DC | PRN
  Administered 2011-11-14: 180 mg via INTRAVENOUS

## 2011-11-14 MED ORDER — HYDROMORPHONE HCL PF 1 MG/ML IJ SOLN
0.2500 mg | INTRAMUSCULAR | Status: DC | PRN
  Administered 2011-11-14 (×3): 0.5 mg via INTRAVENOUS

## 2011-11-14 MED ORDER — MIDAZOLAM HCL 5 MG/5ML IJ SOLN
INTRAMUSCULAR | Status: DC | PRN
  Administered 2011-11-14: 2 mg via INTRAVENOUS

## 2011-11-14 MED ORDER — CEFAZOLIN SODIUM 1-5 GM-% IV SOLN
1.0000 g | Freq: Once | INTRAVENOUS | Status: AC
  Administered 2011-11-14: 1 g via INTRAVENOUS
  Filled 2011-11-14: qty 50

## 2011-11-14 MED ORDER — PROMETHAZINE HCL 25 MG/ML IJ SOLN: 6.2500 mg | INTRAMUSCULAR | Status: DC | PRN

## 2011-11-14 MED ORDER — MEPERIDINE HCL 25 MG/ML IJ SOLN: 6.2500 mg | INTRAMUSCULAR | Status: DC | PRN

## 2011-11-14 MED ORDER — HYDROMORPHONE HCL PF 1 MG/ML IJ SOLN
INTRAMUSCULAR | Status: AC
  Filled 2011-11-14: qty 1

## 2011-11-14 MED ORDER — FENTANYL CITRATE 0.05 MG/ML IJ SOLN
INTRAMUSCULAR | Status: DC | PRN
  Administered 2011-11-14 (×3): 50 ug via INTRAVENOUS
  Administered 2011-11-14: 100 ug via INTRAVENOUS

## 2011-11-14 SURGICAL SUPPLY — 38 items
BANDAGE CONFORM 3  STR LF (GAUZE/BANDAGES/DRESSINGS) ×2 IMPLANT
BANDAGE ELASTIC 3 VELCRO ST LF (GAUZE/BANDAGES/DRESSINGS) ×4 IMPLANT
BANDAGE ELASTIC 4 VELCRO ST LF (GAUZE/BANDAGES/DRESSINGS) IMPLANT
BANDAGE GAUZE ELAST BULKY 4 IN (GAUZE/BANDAGES/DRESSINGS) IMPLANT
BNDG CMPR 9X4 STRL LF SNTH (GAUZE/BANDAGES/DRESSINGS) ×1
BNDG ESMARK 4X9 LF (GAUZE/BANDAGES/DRESSINGS) ×2 IMPLANT
CHLORAPREP W/TINT 26ML (MISCELLANEOUS) IMPLANT
CORDS BIPOLAR (ELECTRODE) ×2 IMPLANT
DRSG ADAPTIC 3X8 NADH LF (GAUZE/BANDAGES/DRESSINGS) IMPLANT
ELECT REM PT RETURN 9FT ADLT (ELECTROSURGICAL)
ELECTRODE REM PT RTRN 9FT ADLT (ELECTROSURGICAL) IMPLANT
GAUZE XEROFORM 1X8 LF (GAUZE/BANDAGES/DRESSINGS) IMPLANT
GAUZE XEROFORM 5X9 LF (GAUZE/BANDAGES/DRESSINGS) ×2 IMPLANT
GLOVE BIO SURGEON STRL SZ 6.5 (GLOVE) ×6 IMPLANT
GLOVE BIOGEL M STRL SZ7.5 (GLOVE) ×2 IMPLANT
GLOVE BIOGEL PI ORTHO PRO SZ7 (GLOVE) ×1
GLOVE PI ORTHO PRO STRL SZ7 (GLOVE) ×1 IMPLANT
GOWN PREVENTION PLUS XLARGE (GOWN DISPOSABLE) ×2 IMPLANT
GOWN STRL NON-REIN LRG LVL3 (GOWN DISPOSABLE) ×2 IMPLANT
KIT BASIN OR (CUSTOM PROCEDURE TRAY) ×2 IMPLANT
KIT ROOM TURNOVER OR (KITS) ×2 IMPLANT
NS IRRIG 1000ML POUR BTL (IV SOLUTION) ×2 IMPLANT
PACK ORTHO EXTREMITY (CUSTOM PROCEDURE TRAY) ×2 IMPLANT
PAD ARMBOARD 7.5X6 YLW CONV (MISCELLANEOUS) ×4 IMPLANT
PAD CAST 4YDX4 CTTN HI CHSV (CAST SUPPLIES) IMPLANT
PADDING CAST COTTON 4X4 STRL (CAST SUPPLIES)
SOAP 2 % CHG 4 OZ (WOUND CARE) ×2 IMPLANT
SPONGE GAUZE 4X4 12PLY (GAUZE/BANDAGES/DRESSINGS) ×2 IMPLANT
SPONGE LAP 18X18 X RAY DECT (DISPOSABLE) IMPLANT
SPONGE LAP 4X18 X RAY DECT (DISPOSABLE) ×2 IMPLANT
SUT ETHILON 5 0 PS 2 18 (SUTURE) ×4 IMPLANT
SUT VIC AB 4-0 P-3 18X BRD (SUTURE) ×1 IMPLANT
SUT VIC AB 4-0 P3 18 (SUTURE) ×1
TOWEL OR 17X24 6PK STRL BLUE (TOWEL DISPOSABLE) ×2 IMPLANT
TOWEL OR 17X26 10 PK STRL BLUE (TOWEL DISPOSABLE) ×2 IMPLANT
TUBE CONNECTING 12X1/4 (SUCTIONS) ×2 IMPLANT
WATER STERILE IRR 1000ML POUR (IV SOLUTION) ×2 IMPLANT
YANKAUER SUCT BULB TIP NO VENT (SUCTIONS) ×2 IMPLANT

## 2011-11-14 NOTE — Transfer of Care (Signed)
Immediate Anesthesia Transfer of Care Note  Patient: Gary [name]  Procedure(s) Performed:  AMPUTATION DIGIT - Revision Amputation Left Middle Finger; REPAIR MULTIPLE LACERATIONS - Repair laceration Left Index Finger  Patient Location: PACU  Anesthesia Type: General  Level of Consciousness: awake, alert  and oriented  Airway & Oxygen Therapy: Patient Spontanous Breathing and Patient connected to nasal cannula oxygen  Post-op Assessment: Report given to PACU RN, Post -op Vital signs reviewed and stable, Post -op Vital signs reviewed and unstable, Anesthesiologist notified, Patient moving all extremities and Patient moving all extremities X 4  Post vital signs: Reviewed and stable  Complications: No apparent anesthesia complications

## 2011-11-14 NOTE — Anesthesia Postprocedure Evaluation (Signed)
  Anesthesia Post-op Note  Patient: Gary [name]  Procedure(s) Performed:  AMPUTATION DIGIT - Revision Amputation Left Middle Finger; REPAIR MULTIPLE LACERATIONS - Repair laceration Left Index Finger  Patient Location: PACU  Anesthesia Type: General  Level of Consciousness: awake  Airway and Oxygen Therapy: Patient Spontanous Breathing  Post-op Pain: mild  Post-op Assessment: Post-op Vital signs reviewed  Post-op Vital Signs: stable  Complications: No apparent anesthesia complications

## 2011-11-14 NOTE — ED Notes (Signed)
Pt undressed and into gown, family has his belongings.
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
## 2011-11-14 NOTE — ED Notes (Signed)
Pt has amputation of mid to distal middle left finger and partial amputation of left index finger.

## 2011-11-14 NOTE — ED Provider Notes (Signed)
Medical screening examination/treatment/procedure(s) were conducted as a shared visit with non-physician practitioner(s) and myself.  I personally evaluated the patient during the encounter Pt has  A partial amputaion of his left middle finger.  Dr. Izora Ribas to see pt .  Benny Lennert, MD 11/14/11 [phone]

## 2011-11-14 NOTE — ED Notes (Signed)
RN states CDU is full at present.

## 2011-11-14 NOTE — Op Note (Signed)
11/14/2011  5:20 PM  PATIENT:  Gary [name]  56 y.o. male  PRE-OPERATIVE DIAGNOSIS:  Amputation Left Middle Finger Laceration Left Index Finger  POST-OPERATIVE DIAGNOSIS:  amputation left middle finger laceration left index finger  PROCEDURE:  Procedure(s): Revision amputation of LLF, with local advancement flap;  Repair central slip LIF, repair nail bed laceration, local advancement flap to cover wound defect dorsal LIF  SURGEON:  Surgeon(s): Johnette Abraham, MD  ANESTHESIA:   general  SPECIMEN:  No Specimen  FINDINGS:  Partial amputation of LLF through middle phalanx - distal part not suitable for replantation; complex laceration with skin loss of the LIF, nail bed laceration, cnetral slip tendon laceration   PATIENT DISPOSITION:  PACU - hemodynamically stable.
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
## 2011-11-14 NOTE — Discharge Planning (Signed)
Discharge Instructions:  Keep your dressing clean, dry and in place until instructed to remove by Dr. Rieley Khalsa.  If the dressing becomes dirty or wet call the office for instructions during business hours. Elevate the extremity to help with swelling, this will also help with any discomfort. Take your medication as prescribed. No lifting with the injured  extremity. If you feel that the dressing is too tight, you may loosen it, but keep it on; finger tips should be pink; if there is a concern, call the office. [phone] Ice may be used if the injury is a fracture, do not apply ice directly to the skin. Please call the office on the next business day after discharge to arrange a follow up appointment.  Call [phone] between the hours of 9am - 5pm M-Th or 9am - 1pm on Fri. For most hand injuries and/or conditions, you may return to work using the uninjured hand (one handed duty) within 24-72 hours.  A detailed note will be provided to you at your follow up appointment or may contact the office prior to your follow up.    

## 2011-11-14 NOTE — Anesthesia Preprocedure Evaluation (Signed)
Anesthesia Evaluation  Patient identified by MRN, date of birth, ID band Patient awake    Reviewed: Allergy & Precautions, H&P , NPO status , Patient's Chart, lab work & pertinent test results  Airway Mallampati: II TM Distance: >3 FB Neck ROM: Full    Dental No notable dental hx. (+) Teeth Intact   Pulmonary neg pulmonary ROS, Current Smoker,  clear to auscultation  Pulmonary exam normal       Cardiovascular neg cardio ROS Regular Normal    Neuro/Psych Depression Negative Neurological ROS     GI/Hepatic negative GI ROS, Neg liver ROS,   Endo/Other  Negative Endocrine ROS  Renal/GU negative Renal ROS  Genitourinary negative   Musculoskeletal negative musculoskeletal ROS (+)   Abdominal   Peds  Hematology negative hematology ROS (+)   Anesthesia Other Findings   Reproductive/Obstetrics                           Anesthesia Physical Anesthesia Plan  ASA: II  Anesthesia Plan: General   Post-op Pain Management: MAC Combined w/ Regional for Post-op pain   Induction: Intravenous, Cricoid pressure planned and Rapid sequence  Airway Management Planned: Oral ETT  Additional Equipment:   Intra-op Plan:   Post-operative Plan: Extubation in OR  Informed Consent: I have reviewed the patients History and Physical, chart, labs and discussed the procedure including the risks, benefits and alternatives for the proposed anesthesia with the patient or authorized representative who has indicated his/her understanding and acceptance.   Dental advisory given  Plan Discussed with: CRNA, Anesthesiologist and Surgeon  Anesthesia Plan Comments:         Anesthesia Quick Evaluation

## 2011-11-15 ENCOUNTER — Encounter (HOSPITAL_COMMUNITY): Payer: Self-pay | Admitting: General Surgery

## 2011-11-15 NOTE — Op Note (Signed)
NAME:  ENNIS, HEAVNER NO.:  [id number]  MEDICAL RECORD NO.:  [id number]  LOCATION:  [facility]                         FACILITY:  [facility]  PHYSICIAN:  Johnette Abraham, MD    DATE OF BIRTH:  October 11, 1955  DATE OF PROCEDURE:  11/14/2011 DATE OF DISCHARGE:  11/14/2011                              OPERATIVE REPORT   PREOPERATIVE DIAGNOSES:  Partial amputation of the left long finger through the middle phalanx.  Complex laceration to the left index finger, suspected tendon injury.  POSTOPERATIVE DIAGNOSES:  Partial amputation of the left long finger through the middle phalanx.  Complex laceration to the left index finger, suspected tendon injury.  PROCEDURE:  Exploration of wounds of both the left long and the left index fingers.  Revision amputation of the left long finger with advancement flap, repair of nail bed of the left index finger, repair of central slip extensor tendon laceration left index finger and local advancement flap in the left index finger to cover a large wound defect of the dorsum of the index finger.  ANESTHESIA:  General.  ESTIMATED BLOOD LOSS:  Minimal.  POSTOPERATIVE COMPLICATIONS:  None.  INDICATIONS:  Mr. Tillery was operating a table saw this afternoon and partially amputated his left long finger and sustained lacerations in his left index finger and presented to the emergency department.  I was consulted for repair.  The distal portion of the long finger was with the patient, however, it was very ratty and poor shape for even an attempt at re-implantation, and this was discussed with the patient. Furthermore, risks, benefits and alternatives of surgery were thoroughly discussed with the patient including infection, stiffness, need for additional surgery, pain etc.  He agreed with these and agreed to proceed with surgery.  PROCEDURE:  The patient was taken to the operating room, placed supine on the operating room table.  Time-out was  performed.  General anesthesia was administered.  Preoperative antibiotics had been given in the ER but were re-dosed.  The arm was exsanguinated, and prepped and draped in normal sterile fashion.  Tourniquet was used and inflated to 250 mmHg beginning with the left long finger.  There was an oblique amputation through the middle phalanx.  There was some skin on the volar aspect.  All of the skin was debrided back to a nice clean edge.  There were some large pieces of bone that were hanging in the tissues, these were rongeured back.  Each neurovascular bundle was grasped and pulled and then trammed so that I could retract the arteries and veins on each side that were cauterized.  Following this, the skin was fashioned and a local advancement flap was made from the volar aspect covering the bone to the dorsal aspect.  This was sutured in place with multiple interrupted 5-0 nylon sutures for good aesthetic repair.  Following, the index finger was evaluated.  There was a dorsal shaving type wound. There was skin loss, nail bed loss, and distal dorsal finger loss. There was a large dorsal flap of skin.  Underneath, there was a laceration at the central slip extensor tendon.  After thorough irrigation, the central slip tendon was approximated  with multiple figure-of-eight sutures.  The nail bed was repaired.  Part of the nail bed was missing.  The dorsal flap of skin was then placed over the tendon and advanced distally to each lateral side to cover the extensor tendon.  __________ placed with multiple interrupted 5-0 nylon sutures.  Afterwards, the tourniquet was released.  A sterile dressing consisting of antibiotic ointment, Xeroform, and a radial gutter splint were placed.  The patient tolerated the procedure well, was taken to recovery room in stable condition.     Johnette Abraham, MD     HCC/MEDQ  D:  11/14/2011  T:  11/15/2011  Job:  [id number]

## 2012-12-19 ENCOUNTER — Emergency Department (HOSPITAL_COMMUNITY)
Admission: EM | Admit: 2012-12-19 | Discharge: 2012-12-19 | Disposition: A | Discharge status: Home / Self Care | Payer: Medicaid Other | Attending: Emergency Medicine | Admitting: Emergency Medicine

## 2012-12-19 ENCOUNTER — Encounter (HOSPITAL_COMMUNITY): Payer: Self-pay | Admitting: *Deleted

## 2012-12-19 ENCOUNTER — Emergency Department (HOSPITAL_COMMUNITY): Payer: Medicaid Other

## 2012-12-19 DIAGNOSIS — Z79899 Other long term (current) drug therapy: Secondary | ICD-10-CM | POA: Insufficient documentation

## 2012-12-19 DIAGNOSIS — Z87891 Personal history of nicotine dependence: Secondary | ICD-10-CM | POA: Insufficient documentation

## 2012-12-19 DIAGNOSIS — IMO0002 Reserved for concepts with insufficient information to code with codable children: Secondary | ICD-10-CM | POA: Insufficient documentation

## 2012-12-19 DIAGNOSIS — Z87828 Personal history of other (healed) physical injury and trauma: Secondary | ICD-10-CM | POA: Insufficient documentation

## 2012-12-19 MED ORDER — IBUPROFEN 800 MG PO TABS
800.0000 mg | ORAL_TABLET | Freq: Once | ORAL | Status: AC
  Administered 2012-12-19: 800 mg via ORAL
  Filled 2012-12-19: qty 1

## 2012-12-19 MED ORDER — OXYCODONE-ACETAMINOPHEN 5-325 MG PO TABS
1.0000 | ORAL_TABLET | Freq: Once | ORAL | Status: AC
  Administered 2012-12-19: 1 via ORAL
  Filled 2012-12-19: qty 1

## 2012-12-19 MED ORDER — NAPROXEN 500 MG PO TABS: 500.0000 mg | ORAL_TABLET | Freq: Two times a day (BID) | ORAL | Status: DC

## 2012-12-19 MED ORDER — CYCLOBENZAPRINE HCL 10 MG PO TABS: 10.0000 mg | ORAL_TABLET | Freq: Three times a day (TID) | ORAL | Status: DC | PRN

## 2012-12-19 MED ORDER — CYCLOBENZAPRINE HCL 10 MG PO TABS
10.0000 mg | ORAL_TABLET | Freq: Once | ORAL | Status: AC
  Administered 2012-12-19: 10 mg via ORAL
  Filled 2012-12-19: qty 1

## 2012-12-19 MED ORDER — OXYCODONE-ACETAMINOPHEN 5-325 MG PO TABS: 1.0000 | ORAL_TABLET | ORAL | Status: DC | PRN
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
## 2012-12-19 NOTE — ED Notes (Signed)
Lower back pain x 2 days.  Reports picked up a transmission with a friend when pain began.

## 2012-12-24 NOTE — ED Provider Notes (Signed)
Medical screening examination/treatment/procedure(s) were performed by non-physician practitioner and as supervising physician I was immediately available for consultation/collaboration.   Ryver Zadrozny III, MD 12/24/12 1104 

## 2013-08-16 ENCOUNTER — Ambulatory Visit (HOSPITAL_COMMUNITY)
Admission: RE | Admit: 2013-08-16 | Discharge: 2013-08-16 | Disposition: A | Discharge status: Home / Self Care | Payer: Medicaid Other | Source: Ambulatory Visit | Attending: Internal Medicine | Admitting: Internal Medicine

## 2013-08-16 ENCOUNTER — Other Ambulatory Visit (HOSPITAL_COMMUNITY): Payer: Self-pay | Admitting: Internal Medicine

## 2013-08-16 DIAGNOSIS — R52 Pain, unspecified: Secondary | ICD-10-CM

## 2013-08-16 DIAGNOSIS — M545 Low back pain, unspecified: Secondary | ICD-10-CM | POA: Insufficient documentation

## 2013-08-16 DIAGNOSIS — Z9889 Other specified postprocedural states: Secondary | ICD-10-CM | POA: Insufficient documentation

## 2013-08-16 DIAGNOSIS — M47817 Spondylosis without myelopathy or radiculopathy, lumbosacral region: Secondary | ICD-10-CM | POA: Insufficient documentation

## 2013-09-15 ENCOUNTER — Telehealth: Payer: Self-pay

## 2013-09-15 NOTE — Telephone Encounter (Signed)
Pt was referred by Dr. Felecia Shelling for screening colonoscopy. He is scheduled an OV with Tana Coast, PA on 10/18/2013 at 9:30 AM due to his meds.

## 2013-10-14 DIAGNOSIS — F3189 Other bipolar disorder: Secondary | ICD-10-CM | POA: Diagnosis not present

## 2013-10-16 ENCOUNTER — Encounter (HOSPITAL_COMMUNITY): Payer: Self-pay | Admitting: Emergency Medicine

## 2013-10-16 ENCOUNTER — Emergency Department (HOSPITAL_COMMUNITY)
Admission: EM | Admit: 2013-10-16 | Discharge: 2013-10-16 | Disposition: A | Discharge status: Home / Self Care | Payer: Medicare Other | Attending: Emergency Medicine | Admitting: Emergency Medicine

## 2013-10-16 DIAGNOSIS — Z79899 Other long term (current) drug therapy: Secondary | ICD-10-CM | POA: Insufficient documentation

## 2013-10-16 DIAGNOSIS — L03039 Cellulitis of unspecified toe: Secondary | ICD-10-CM | POA: Diagnosis not present

## 2013-10-16 DIAGNOSIS — Z87891 Personal history of nicotine dependence: Secondary | ICD-10-CM | POA: Insufficient documentation

## 2013-10-16 DIAGNOSIS — Z87828 Personal history of other (healed) physical injury and trauma: Secondary | ICD-10-CM | POA: Insufficient documentation

## 2013-10-16 DIAGNOSIS — Z791 Long term (current) use of non-steroidal anti-inflammatories (NSAID): Secondary | ICD-10-CM | POA: Diagnosis not present

## 2013-10-16 DIAGNOSIS — L02619 Cutaneous abscess of unspecified foot: Secondary | ICD-10-CM | POA: Diagnosis not present

## 2013-10-16 DIAGNOSIS — L0390 Cellulitis, unspecified: Secondary | ICD-10-CM

## 2013-10-16 MED ORDER — CEPHALEXIN 500 MG PO CAPS: 500.0000 mg | ORAL_CAPSULE | Freq: Four times a day (QID) | ORAL | Status: DC

## 2013-10-16 MED ORDER — HYDROCODONE-ACETAMINOPHEN 5-325 MG PO TABS: 1.0000 | ORAL_TABLET | ORAL | Status: DC | PRN

## 2013-10-16 NOTE — Discharge Instructions (Signed)
Cellulitis Cellulitis is an infection of the skin and the tissue beneath it. The infected area is usually red and tender. Cellulitis occurs most often in the arms and lower legs.  CAUSES  Cellulitis is caused by bacteria that enter the skin through cracks or cuts in the skin. The most common types of bacteria that cause cellulitis are Staphylococcus and Streptococcus. SYMPTOMS   Redness and warmth.  Swelling.  Tenderness or pain.  Fever. DIAGNOSIS  Your caregiver can usually determine what is wrong based on a physical exam. Blood tests may also be done. TREATMENT  Treatment usually involves taking an antibiotic medicine. HOME CARE INSTRUCTIONS   Take your antibiotics as directed. Finish them even if you start to feel better.  Keep the infected arm or leg elevated to reduce swelling.  Apply a warm cloth to the affected area up to 4 times per day to relieve pain.  Only take over-the-counter or prescription medicines for pain, discomfort, or fever as directed by your caregiver.  Keep all follow-up appointments as directed by your caregiver. SEEK MEDICAL CARE IF:   You notice red streaks coming from the infected area.  Your red area gets larger or turns dark in color.  Your bone or joint underneath the infected area becomes painful after the skin has healed.  Your infection returns in the same area or another area.  You notice a swollen bump in the infected area.  You develop new symptoms. SEEK IMMEDIATE MEDICAL CARE IF:   You have a fever.  You feel very sleepy.  You develop vomiting or diarrhea.  You have a general ill feeling (malaise) with muscle aches and pains. MAKE SURE YOU:   Understand these instructions.  Will watch your condition.  Will get help right away if you are not doing well or get worse. Document Released: 07/03/2005 Document Revised: 03/24/2012 Document Reviewed: 12/09/2011 [facility] Patient Information 2014 [place]. Infected Ingrown  Toenail An infected ingrown toenail occurs when the nail edge grows into the skin and bacteria invade the area. Symptoms include pain, tenderness, swelling, and pus drainage from the edge of the nail. Poorly fitting shoes, minor injuries, and improper cutting of the toenail may also contribute to the problem. You should cut your toenails squarely instead of rounding the edges. Do not cut them too short. Avoid tight or pointed toe shoes. Sometimes the ingrown portion of the nail must be removed. If your toenail is removed, it can take 3-4 months for it to re-grow. HOME CARE INSTRUCTIONS   Soak your infected toe in warm water for 20-30 minutes, 2 to 3 times a day.  Packing or dressings applied to the area should be changed daily.  Take medicine as directed and finish them.  Reduce activities and keep your foot elevated when able to reduce swelling and discomfort. Do this until the infection gets better.  Wear sandals or go barefoot as much as possible while the infected area is sensitive.  See your caregiver for follow-up care in 2-3 days if the infection is not better. SEEK MEDICAL CARE IF:  Your toe is becoming more red, swollen or painful. MAKE SURE YOU:   Understand these instructions.  Will watch your condition.  Will get help right away if you are not doing well or get worse. Document Released: 10/31/2004 Document Revised: 12/16/2011 Document Reviewed: 09/19/2008 [facility] Patient Information 2014 [place].
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
## 2013-10-16 NOTE — ED Notes (Addendum)
Left  big toe pain " for a while".  Denies injury

## 2013-10-17 NOTE — ED Provider Notes (Signed)
Medical screening examination/treatment/procedure(s) were performed by non-physician practitioner and as supervising physician I was immediately available for consultation/collaboration.  EKG Interpretation   None        Babette Relic, MD 10/17/13 1245

## 2013-10-18 ENCOUNTER — Ambulatory Visit: Payer: Self-pay | Admitting: Gastroenterology

## 2013-11-11 ENCOUNTER — Ambulatory Visit: Payer: Self-pay | Admitting: Gastroenterology

## 2013-11-11 ENCOUNTER — Telehealth: Payer: Self-pay | Admitting: Gastroenterology

## 2013-11-11 NOTE — Telephone Encounter (Signed)
Pt was a no show

## 2013-11-16 ENCOUNTER — Encounter: Payer: Self-pay | Admitting: Gastroenterology

## 2013-11-16 NOTE — Telephone Encounter (Signed)
Mailed letter °

## 2013-11-16 NOTE — Telephone Encounter (Signed)
Please offer reschedule.

## 2013-12-06 ENCOUNTER — Encounter (INDEPENDENT_AMBULATORY_CARE_PROVIDER_SITE_OTHER): Payer: Self-pay

## 2013-12-06 ENCOUNTER — Encounter: Payer: Self-pay | Admitting: Gastroenterology

## 2013-12-06 ENCOUNTER — Encounter (HOSPITAL_COMMUNITY): Payer: Self-pay | Admitting: Pharmacy Technician

## 2013-12-06 ENCOUNTER — Ambulatory Visit (INDEPENDENT_AMBULATORY_CARE_PROVIDER_SITE_OTHER): Payer: Medicare Other | Admitting: Gastroenterology

## 2013-12-06 VITALS — BP 122/62 | HR 69 | Temp 97.3°F | Ht 69.0 in | Wt 157.4 lb

## 2013-12-06 DIAGNOSIS — K219 Gastro-esophageal reflux disease without esophagitis: Secondary | ICD-10-CM | POA: Diagnosis not present

## 2013-12-06 DIAGNOSIS — Z1211 Encounter for screening for malignant neoplasm of colon: Secondary | ICD-10-CM | POA: Diagnosis not present

## 2013-12-06 DIAGNOSIS — Z7689 Persons encountering health services in other specified circumstances: Secondary | ICD-10-CM | POA: Insufficient documentation

## 2013-12-06 DIAGNOSIS — R6881 Early satiety: Secondary | ICD-10-CM | POA: Diagnosis not present

## 2013-12-06 MED ORDER — PEG 3350-KCL-NA BICARB-NACL 420 G PO SOLR: 4000.0000 mL | ORAL | Status: DC

## 2013-12-06 MED ORDER — ESOMEPRAZOLE MAGNESIUM 40 MG PO CPDR: 40.0000 mg | DELAYED_RELEASE_CAPSULE | Freq: Every day | ORAL | Status: DC

## 2013-12-06 NOTE — Addendum Note (Signed)
Addended by: Idamae Schuller on: 12/06/2013 10:27 AM   Modules accepted: Orders
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
## 2013-12-06 NOTE — Patient Instructions (Addendum)
1. Colonoscopy and upper endoscopy in 3 weeks as scheduled. Please see separate instructions. 2. If you develop worsening abdominal pain or diarrhea does not resolve, please let me know as soon as possible. 3. Start Nexium one daily for acid reflux. Prescription sent to your pharmacy.
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
## 2013-12-06 NOTE — Addendum Note (Signed)
Addended by: Idamae Schuller on: 12/06/2013 10:30 AM   Modules accepted: Orders

## 2013-12-06 NOTE — Assessment & Plan Note (Addendum)
Due for first ever screening colonoscopy. Given polypharmacy, augment conscious sedation with phenergan 25mg  IV 30 minutes before procedure.  I have discussed the risks, alternatives, benefits with regards to but not limited to the risk of reaction to medication, bleeding, infection, perforation and the patient is agreeable to proceed. Written consent to be obtained. Patient is currently recovering from recent viral gastroenteritis or food poisoning. We will plan in colonoscopy the latter part of the month. He will call if his abdominal discomfort or diarrhea are not completely resolved in the next one week or sooner if has relapse.

## 2013-12-07 NOTE — Progress Notes (Signed)
cc'd to pcp 

## 2013-12-08 MED ORDER — PEG 3350-KCL-NA BICARB-NACL 420 G PO SOLR: 4000.0000 mL | ORAL | Status: DC

## 2013-12-08 NOTE — Addendum Note (Signed)
Addended by: Idamae Schuller on: 12/08/2013 09:11 AM   Modules accepted: Orders

## 2013-12-27 ENCOUNTER — Other Ambulatory Visit: Payer: Self-pay | Admitting: Internal Medicine

## 2013-12-27 ENCOUNTER — Encounter (HOSPITAL_COMMUNITY): Payer: Self-pay | Admitting: *Deleted

## 2013-12-27 ENCOUNTER — Ambulatory Visit (HOSPITAL_COMMUNITY)
Admission: RE | Admit: 2013-12-27 | Discharge: 2013-12-27 | Disposition: A | Discharge status: Home / Self Care | Payer: Medicare Other | Source: Ambulatory Visit | Attending: Internal Medicine | Admitting: Internal Medicine

## 2013-12-27 ENCOUNTER — Encounter: Payer: Self-pay | Admitting: Internal Medicine

## 2013-12-27 ENCOUNTER — Encounter (HOSPITAL_COMMUNITY)
Admission: RE | Disposition: A | Discharge status: Home / Self Care | Payer: Self-pay | Source: Ambulatory Visit | Attending: Internal Medicine

## 2013-12-27 DIAGNOSIS — R6881 Early satiety: Secondary | ICD-10-CM

## 2013-12-27 DIAGNOSIS — F17200 Nicotine dependence, unspecified, uncomplicated: Secondary | ICD-10-CM | POA: Insufficient documentation

## 2013-12-27 DIAGNOSIS — K219 Gastro-esophageal reflux disease without esophagitis: Secondary | ICD-10-CM | POA: Diagnosis not present

## 2013-12-27 DIAGNOSIS — D126 Benign neoplasm of colon, unspecified: Secondary | ICD-10-CM | POA: Insufficient documentation

## 2013-12-27 DIAGNOSIS — Z1211 Encounter for screening for malignant neoplasm of colon: Secondary | ICD-10-CM

## 2013-12-27 DIAGNOSIS — K2940 Chronic atrophic gastritis without bleeding: Secondary | ICD-10-CM | POA: Diagnosis not present

## 2013-12-27 DIAGNOSIS — K5289 Other specified noninfective gastroenteritis and colitis: Secondary | ICD-10-CM | POA: Diagnosis not present

## 2013-12-27 HISTORY — PX: COLONOSCOPY WITH ESOPHAGOGASTRODUODENOSCOPY (EGD): SHX5779

## 2013-12-27 SURGERY — COLONOSCOPY WITH ESOPHAGOGASTRODUODENOSCOPY (EGD)
Anesthesia: Moderate Sedation

## 2013-12-27 MED ORDER — PROMETHAZINE HCL 25 MG/ML IJ SOLN
INTRAMUSCULAR | Status: AC
  Filled 2013-12-27: qty 1

## 2013-12-27 MED ORDER — LIDOCAINE VISCOUS 2 % MT SOLN
OROMUCOSAL | Status: AC
  Filled 2013-12-27: qty 15

## 2013-12-27 MED ORDER — MIDAZOLAM HCL 5 MG/5ML IJ SOLN
INTRAMUSCULAR | Status: AC
  Filled 2013-12-27: qty 10

## 2013-12-27 MED ORDER — LIDOCAINE VISCOUS 2 % MT SOLN
OROMUCOSAL | Status: DC | PRN
  Administered 2013-12-27: 3 mL via OROMUCOSAL

## 2013-12-27 MED ORDER — MIDAZOLAM HCL 5 MG/5ML IJ SOLN
INTRAMUSCULAR | Status: DC | PRN
  Administered 2013-12-27 (×2): 1 mg via INTRAVENOUS
  Administered 2013-12-27: 2 mg via INTRAVENOUS

## 2013-12-27 MED ORDER — MEPERIDINE HCL 100 MG/ML IJ SOLN
INTRAMUSCULAR | Status: DC | PRN
  Administered 2013-12-27: 50 mg via INTRAVENOUS
  Administered 2013-12-27: 25 mg via INTRAVENOUS

## 2013-12-27 MED ORDER — PROMETHAZINE HCL 25 MG/ML IJ SOLN
25.0000 mg | Freq: Once | INTRAMUSCULAR | Status: AC
  Administered 2013-12-27: 25 mg via INTRAVENOUS

## 2013-12-27 MED ORDER — SODIUM CHLORIDE 0.9 % IJ SOLN
INTRAMUSCULAR | Status: AC
  Filled 2013-12-27: qty 10

## 2013-12-27 MED ORDER — STERILE WATER FOR IRRIGATION IR SOLN
Status: DC | PRN
  Administered 2013-12-27: 09:00:00

## 2013-12-27 MED ORDER — MEPERIDINE HCL 100 MG/ML IJ SOLN
INTRAMUSCULAR | Status: AC
  Filled 2013-12-27: qty 2

## 2013-12-27 MED ORDER — ONDANSETRON HCL 4 MG/2ML IJ SOLN
INTRAMUSCULAR | Status: DC | PRN
  Administered 2013-12-27: 4 mg via INTRAVENOUS

## 2013-12-27 MED ORDER — SODIUM CHLORIDE 0.9 % IV SOLN
INTRAVENOUS | Status: DC
  Administered 2013-12-27: 08:00:00 via INTRAVENOUS

## 2013-12-27 MED ORDER — ONDANSETRON HCL 4 MG/2ML IJ SOLN
INTRAMUSCULAR | Status: AC
  Filled 2013-12-27: qty 2

## 2013-12-27 NOTE — Discharge Instructions (Addendum)
Colonoscopy Discharge Instructions  Read the instructions outlined below and refer to this sheet in the next few weeks. These discharge instructions provide you with general information on caring for yourself after you leave the hospital. Your doctor may also give you specific instructions. While your treatment has been planned according to the most current medical practices available, unavoidable complications occasionally occur. If you have any problems or questions after discharge, call Dr. Gala Romney at [phone]. ACTIVITY  You may resume your regular activity, but move at a slower pace for the next 24 hours.   Take frequent rest periods for the next 24 hours.   Walking will help get rid of the air and reduce the bloated feeling in your belly (abdomen).   No driving for 24 hours (because of the medicine (anesthesia) used during the test).    Do not sign any important legal documents or operate any machinery for 24 hours (because of the anesthesia used during the test).  NUTRITION  Drink plenty of fluids.   You may resume your normal diet as instructed by your doctor.   Begin with a light meal and progress to your normal diet. Heavy or fried foods are harder to digest and may make you feel sick to your stomach (nauseated).   Avoid alcoholic beverages for 24 hours or as instructed.  MEDICATIONS  You may resume your normal medications unless your doctor tells you otherwise.  WHAT YOU CAN EXPECT TODAY  Some feelings of bloating in the abdomen.   Passage of more gas than usual.   Spotting of blood in your stool or on the toilet paper.  IF YOU HAD POLYPS REMOVED DURING THE COLONOSCOPY:  No aspirin products for 7 days or as instructed.   No alcohol for 7 days or as instructed.   Eat a soft diet for the next 24 hours.  FINDING OUT THE RESULTS OF YOUR TEST Not all test results are available during your visit. If your test results are not back during the visit, make an appointment  with your caregiver to find out the results. Do not assume everything is normal if you have not heard from your caregiver or the medical facility. It is important for you to follow up on all of your test results.  SEEK IMMEDIATE MEDICAL ATTENTION IF:  You have more than a spotting of blood in your stool.   Your belly is swollen (abdominal distention).   You are nauseated or vomiting.   You have a temperature over 101.  You have abdominal pain or discomfort that is severe or gets worse throughout the day. EGD Discharge instructions Please read the instructions outlined below and refer to this sheet in the next few weeks. These discharge instructions provide you with general information on caring for yourself after you leave the hospital. Your doctor may also give you specific instructions. While your treatment has been planned according to the most current medical practices available, unavoidable complications occasionally occur. If you have any problems or questions after discharge, please call your doctor. ACTIVITY You may resume your regular activity but move at a slower pace for the next 24 hours.  Take frequent rest periods for the next 24 hours.  Walking will help expel (get rid of) the air and reduce the bloated feeling in your abdomen.  No driving for 24 hours (because of the anesthesia (medicine) used during the test).  You may shower.  Do not sign any important legal documents or operate any machinery for 24  hours (because of the anesthesia used during the test).  NUTRITION Drink plenty of fluids.  You may resume your normal diet.  Begin with a light meal and progress to your normal diet.  Avoid alcoholic beverages for 24 hours or as instructed by your caregiver.  MEDICATIONS You may resume your normal medications unless your caregiver tells you otherwise.  WHAT YOU CAN EXPECT TODAY You may experience abdominal discomfort such as a feeling of fullness or gas pains.   FOLLOW-UP Your doctor will discuss the results of your test with you.  SEEK IMMEDIATE MEDICAL ATTENTION IF ANY OF THE FOLLOWING OCCUR: Excessive nausea (feeling sick to your stomach) and/or vomiting.  Severe abdominal pain and distention (swelling).  Trouble swallowing.  Temperature over 101 F (37.8 C).  Rectal bleeding or vomiting of blood.     Schedule a solid phase gastric emptying study-early satiety; retained gastric contents on EGD today  Polyp information provided  Stop Nexium; Begin Dexilant 60 mg daily-go by my office for free samples  Further recommendations to follow pending review of pathology report   Colon Polyps Polyps are lumps of extra tissue growing inside the body. Polyps can grow in the large intestine (colon). Most colon polyps are noncancerous (benign). However, some colon polyps can become cancerous over time. Polyps that are larger than a pea may be harmful. To be safe, caregivers remove and test all polyps. CAUSES  Polyps form when mutations in the genes cause your cells to grow and divide even though no more tissue is needed. RISK FACTORS There are a number of risk factors that can increase your chances of getting colon polyps. They include:  Being older than 50 years.  Family history of colon polyps or colon cancer.  Long-term colon diseases, such as colitis or Crohn disease.  Being overweight.  Smoking.  Being inactive.  Drinking too much alcohol. SYMPTOMS  Most small polyps do not cause symptoms. If symptoms are present, they may include:  Blood in the stool. The stool may look dark red or black.  Constipation or diarrhea that lasts longer than 1 week. DIAGNOSIS People often do not know they have polyps until their caregiver finds them during a regular checkup. Your caregiver can use 4 tests to check for polyps:  Digital rectal exam. The caregiver wears gloves and feels inside the rectum. This test would find polyps only in the  rectum.  Barium enema. The caregiver puts a liquid called barium into your rectum before taking X-rays of your colon. Barium makes your colon look white. Polyps are dark, so they are easy to see in the X-ray pictures.  Sigmoidoscopy. A thin, flexible tube (sigmoidoscope) is placed into your rectum. The sigmoidoscope has a light and tiny camera in it. The caregiver uses the sigmoidoscope to look at the last third of your colon.  Colonoscopy. This test is like sigmoidoscopy, but the caregiver looks at the entire colon. This is the most common method for finding and removing polyps. TREATMENT  Any polyps will be removed during a sigmoidoscopy or colonoscopy. The polyps are then tested for cancer. PREVENTION  To help lower your risk of getting more colon polyps:  Eat plenty of fruits and vegetables. Avoid eating fatty foods.  Do not smoke.  Avoid drinking alcohol.  Exercise every day.  Lose weight if recommended by your caregiver.  Eat plenty of calcium and folate. Foods that are rich in calcium include milk, cheese, and broccoli. Foods that are rich in folate include chickpeas,  kidney beans, and spinach. HOME CARE INSTRUCTIONS Keep all follow-up appointments as directed by your caregiver. You may need periodic exams to check for polyps. SEEK MEDICAL CARE IF: You notice bleeding during a bowel movement. Document Released: 06/19/2004 Document Revised: 12/16/2011 Document Reviewed: 12/03/2011 [facility] Patient Information 2014 [place].

## 2013-12-27 NOTE — Interval H&P Note (Signed)
History and Physical Interval Note:  12/27/2013 8:50 AM  Gary [name]  has presented today for surgery, with the diagnosis of GERD, EARLY SATIETY, SCREENING TCS  The various methods of treatment have been discussed with the patient and family. After consideration of risks, benefits and other options for treatment, the patient has consented to  Procedure(s) with comments: COLONOSCOPY WITH ESOPHAGOGASTRODUODENOSCOPY (EGD) (N/A) - 8:30AM as a surgical intervention .  The patient's history has been reviewed, patient examined, no change in status, stable for surgery.  I have reviewed the patient's chart and labs.  Questions were answered to the patient's satisfaction.     No change. EGD and colonoscopy per plan.  The risks, benefits, limitations, alternatives and imponderables have been reviewed with the patient. Potential for esophageal dilation, biopsy, etc. have also been reviewed.  Questions have been answered. All parties agreeable.  Manus Rudd
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
## 2013-12-27 NOTE — Op Note (Signed)
[place], [address]   COLONOSCOPY PROCEDURE REPORT  PATIENT: Gary [name], Gary [name]  MR#:         [id number] BIRTHDATE: [birth year] , 48  yrs. old GENDER: Male ENDOSCOPIST: R.  Garfield Cornea, MD FACP FACG REFERRED BY:  Conni Slipper, M.D. PROCEDURE DATE:  12/27/2013 PROCEDURE:     Colonoscopy with multiple snare polypectomies  INDICATIONS: First-ever average risk for history examination  INFORMED CONSENT:  The risks, benefits, alternatives and imponderables including but not limited to bleeding, perforation as well as the possibility of a missed lesion have been reviewed.  The potential for biopsy, lesion removal, etc. have also been discussed.  Questions have been answered.  All parties agreeable. Please see the history and physical in the medical record for more information.  MEDICATIONS: Versed 4 mg IV and Demerol 75 mg IV in divided doses. Zofran 4 mg IV. Phenergan 25 mg IV.  DESCRIPTION OF PROCEDURE:  After a digital rectal exam was performed, the EG-[id number] ([id number]) and EC-[id number] ([id number]) colonoscope was advanced from the anus through the rectum and colon to the area of the cecum, ileocecal valve and appendiceal orifice. The cecum was deeply intubated.  These structures were well-seen and photographed for the record.  From the level of the cecum and ileocecal valve, the scope was slowly and cautiously withdrawn. The mucosal surfaces were carefully surveyed utilizing scope tip deflection to facilitate fold flattening as needed.  The scope was pulled down into the rectum where a thorough examination including retroflexion was performed.    FINDINGS:  Inadequate preparation as far as polyp detection is concerned. Grossly normal rectum ; (2) polyps in the mid ascending segment.   There was one polyp definitely seen in the mid descending segment; a good bit of colonic mucosa wa obscured by the poor prep.  A significant lesion could have  easily been missed in the setting of poor preparation.  THERAPEUTIC / DIAGNOSTIC MANEUVERS PERFORMED:  The above-mentioned polyps were hot snare removed.  COMPLICATIONS: None  CECAL WITHDRAWAL TIME:  12 minutes  IMPRESSION:  Multiple colonic polyps-removed as described above. Inadequate/incomplete exam secondary to poor preparation as described above.  RECOMMENDATIONS: Followup on pathology. See EGD report.   _______________________________ eSigned:  R. Garfield Cornea, MD FACP [facility] 12/27/2013 9:39 AM   CC:    PATIENT NAME:  Gary [name], Gary [name] MR#: [id number]
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
## 2013-12-29 ENCOUNTER — Encounter: Payer: Self-pay | Admitting: Internal Medicine

## 2013-12-30 ENCOUNTER — Encounter (HOSPITAL_COMMUNITY)
Admission: RE | Admit: 2013-12-30 | Discharge: 2013-12-30 | Disposition: A | Discharge status: Home / Self Care | Payer: Medicare Other | Source: Ambulatory Visit | Attending: Internal Medicine | Admitting: Internal Medicine

## 2013-12-30 ENCOUNTER — Encounter (HOSPITAL_COMMUNITY): Payer: Self-pay | Admitting: Internal Medicine

## 2013-12-30 DIAGNOSIS — R6881 Early satiety: Secondary | ICD-10-CM | POA: Diagnosis not present

## 2013-12-30 MED ORDER — TECHNETIUM TC 99M SULFUR COLLOID
2.0000 | Freq: Once | INTRAVENOUS | Status: AC | PRN
  Administered 2013-12-30: 2 via ORAL

## 2014-01-04 ENCOUNTER — Encounter: Payer: Self-pay | Admitting: Gastroenterology

## 2014-01-04 ENCOUNTER — Other Ambulatory Visit: Payer: Self-pay | Admitting: Internal Medicine

## 2014-01-04 MED ORDER — METOCLOPRAMIDE HCL 5 MG PO TABS: 5.0000 mg | ORAL_TABLET | Freq: Three times a day (TID) | ORAL | Status: DC

## 2014-01-05 DIAGNOSIS — F3189 Other bipolar disorder: Secondary | ICD-10-CM | POA: Diagnosis not present

## 2014-02-07 ENCOUNTER — Encounter: Payer: Self-pay | Admitting: Gastroenterology

## 2014-02-07 ENCOUNTER — Telehealth: Payer: Self-pay | Admitting: Gastroenterology

## 2014-02-07 ENCOUNTER — Ambulatory Visit: Payer: Medicaid Other | Admitting: Gastroenterology

## 2014-02-07 NOTE — Telephone Encounter (Signed)
Pt was a no show

## 2014-02-07 NOTE — Telephone Encounter (Signed)
MAILED LETTER °

## 2014-07-12 DIAGNOSIS — Z23 Encounter for immunization: Secondary | ICD-10-CM | POA: Diagnosis not present

## 2014-07-12 DIAGNOSIS — M549 Dorsalgia, unspecified: Secondary | ICD-10-CM | POA: Diagnosis not present

## 2014-07-12 DIAGNOSIS — H6091 Unspecified otitis externa, right ear: Secondary | ICD-10-CM | POA: Diagnosis not present

## 2014-07-14 ENCOUNTER — Emergency Department (HOSPITAL_COMMUNITY)
Admission: EM | Admit: 2014-07-14 | Discharge: 2014-07-14 | Disposition: A | Discharge status: Home / Self Care | Payer: Medicare Other | Attending: Emergency Medicine | Admitting: Emergency Medicine

## 2014-07-14 ENCOUNTER — Encounter (HOSPITAL_COMMUNITY): Payer: Self-pay | Admitting: Emergency Medicine

## 2014-07-14 DIAGNOSIS — Z720 Tobacco use: Secondary | ICD-10-CM | POA: Diagnosis not present

## 2014-07-14 DIAGNOSIS — Z9889 Other specified postprocedural states: Secondary | ICD-10-CM | POA: Insufficient documentation

## 2014-07-14 DIAGNOSIS — G8929 Other chronic pain: Secondary | ICD-10-CM | POA: Diagnosis not present

## 2014-07-14 DIAGNOSIS — M545 Low back pain, unspecified: Secondary | ICD-10-CM

## 2014-07-14 DIAGNOSIS — Z79899 Other long term (current) drug therapy: Secondary | ICD-10-CM | POA: Insufficient documentation

## 2014-07-14 MED ORDER — CYCLOBENZAPRINE HCL 10 MG PO TABS: 10.0000 mg | ORAL_TABLET | Freq: Two times a day (BID) | ORAL | Status: DC | PRN

## 2014-07-14 MED ORDER — HYDROCODONE-ACETAMINOPHEN 5-325 MG PO TABS: 1.0000 | ORAL_TABLET | ORAL | Status: DC | PRN

## 2014-07-14 MED ORDER — OXYCODONE-ACETAMINOPHEN 5-325 MG PO TABS
1.0000 | ORAL_TABLET | Freq: Once | ORAL | Status: AC
  Administered 2014-07-14: 1 via ORAL
  Filled 2014-07-14: qty 1

## 2014-07-14 MED ORDER — DIAZEPAM 5 MG PO TABS
5.0000 mg | ORAL_TABLET | Freq: Once | ORAL | Status: AC
  Administered 2014-07-14: 5 mg via ORAL
  Filled 2014-07-14: qty 1
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
## 2014-07-14 NOTE — Discharge Instructions (Signed)
Follow up with the pain management clinic as planned.  °

## 2014-07-14 NOTE — ED Notes (Signed)
Low back pain, no known injury . Has seen Dr Legrand Rams and is arranging pain management.

## 2014-07-16 NOTE — ED Provider Notes (Signed)
Medical screening examination/treatment/procedure(s) were performed by non-physician practitioner and as supervising physician I was immediately available for consultation/collaboration.   EKG Interpretation None        Merryl Hacker, MD 07/16/14 [phone]

## 2014-08-12 ENCOUNTER — Emergency Department (HOSPITAL_COMMUNITY)
Admission: EM | Admit: 2014-08-12 | Discharge: 2014-08-12 | Disposition: A | Discharge status: Home / Self Care | Payer: Medicare Other | Attending: Emergency Medicine | Admitting: Emergency Medicine

## 2014-08-12 ENCOUNTER — Encounter (HOSPITAL_COMMUNITY): Payer: Self-pay | Admitting: Emergency Medicine

## 2014-08-12 DIAGNOSIS — M659 Synovitis and tenosynovitis, unspecified: Secondary | ICD-10-CM | POA: Diagnosis not present

## 2014-08-12 DIAGNOSIS — Z87828 Personal history of other (healed) physical injury and trauma: Secondary | ICD-10-CM | POA: Insufficient documentation

## 2014-08-12 DIAGNOSIS — M79601 Pain in right arm: Secondary | ICD-10-CM | POA: Diagnosis present

## 2014-08-12 DIAGNOSIS — M778 Other enthesopathies, not elsewhere classified: Secondary | ICD-10-CM

## 2014-08-12 DIAGNOSIS — Z720 Tobacco use: Secondary | ICD-10-CM | POA: Diagnosis not present

## 2014-08-12 DIAGNOSIS — M7721 Periarthritis, right wrist: Secondary | ICD-10-CM | POA: Diagnosis not present

## 2014-08-12 MED ORDER — DICLOFENAC SODIUM 75 MG PO TBEC: 75.0000 mg | DELAYED_RELEASE_TABLET | Freq: Two times a day (BID) | ORAL | Status: DC

## 2014-08-12 MED ORDER — HYDROCODONE-ACETAMINOPHEN 5-325 MG PO TABS: ORAL_TABLET | ORAL | Status: DC

## 2014-08-12 NOTE — ED Notes (Signed)
Patient with no complaints at this time. Respirations even and unlabored. Skin warm/dry. Discharge instructions reviewed with patient at this time. Patient given opportunity to voice concerns/ask questions. Patient discharged at this time and left Emergency Department with steady gait.   
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
## 2014-08-12 NOTE — ED Notes (Signed)
PT c/o right forearm and wrist pain x4 days with movement. PT denies any injury to extremity.

## 2014-10-31 ENCOUNTER — Other Ambulatory Visit: Payer: Self-pay | Admitting: Nurse Practitioner

## 2014-11-03 ENCOUNTER — Encounter: Payer: Self-pay | Admitting: Gastroenterology

## 2014-11-03 ENCOUNTER — Ambulatory Visit (INDEPENDENT_AMBULATORY_CARE_PROVIDER_SITE_OTHER): Payer: Medicare Other | Admitting: Gastroenterology

## 2014-11-03 ENCOUNTER — Other Ambulatory Visit: Payer: Self-pay

## 2014-11-03 VITALS — BP 130/65 | HR 68 | Temp 97.4°F | Ht 69.0 in | Wt 155.2 lb

## 2014-11-03 DIAGNOSIS — K529 Noninfective gastroenteritis and colitis, unspecified: Secondary | ICD-10-CM

## 2014-11-03 DIAGNOSIS — Z86010 Personal history of colonic polyps: Secondary | ICD-10-CM

## 2014-11-03 MED ORDER — PEG 3350-KCL-NA BICARB-NACL 420 G PO SOLR: 4000.0000 mL | Freq: Once | ORAL | Status: DC

## 2014-11-03 MED ORDER — DICYCLOMINE HCL 10 MG PO CAPS: 10.0000 mg | ORAL_CAPSULE | Freq: Three times a day (TID) | ORAL | Status: DC

## 2014-11-03 NOTE — Progress Notes (Signed)
cc'ed to pcp °

## 2014-11-03 NOTE — Assessment & Plan Note (Signed)
59 year old male with multiple tubular adenomas on last colonoscopy in 2015 and poor prep noted. Due for early interval surveillance now. Will provide additional 2 days of clear liquids prior to procedure.   Proceed with TCS with Dr. Gala Romney in near future: the risks, benefits, and alternatives have been discussed with the patient in detail. The patient states understanding and desires to proceed.
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
## 2014-11-03 NOTE — Assessment & Plan Note (Addendum)
Noted since abdominal exploration in 2012. No rectal bleeding. Unclear etiology. Doubt infectious process due to chronicity. Regardless, no stool studies on file thus far. Will check stool studies and add pancreatic elastase. Trial of Bentyl and probiotic for supportive measures. Return in 3 months.

## 2014-11-03 NOTE — Patient Instructions (Signed)
Please complete the stool studies and return to the lab.  Start taking a probiotic daily such as [employer] engineer, [employer], [employer], [employer] [place], [employer] brand.   I have sent Bentyl to your pharmacy to take with meals and at bedtime for abdominal cramping and loose stool. You may take as needed but no more than 4 times a day. Monitor for constipation, dry mouth.

## 2014-11-08 ENCOUNTER — Other Ambulatory Visit: Payer: Self-pay | Admitting: Gastroenterology

## 2014-11-08 DIAGNOSIS — K529 Noninfective gastroenteritis and colitis, unspecified: Secondary | ICD-10-CM | POA: Diagnosis not present

## 2014-11-09 LAB — CLOSTRIDIUM DIFFICILE BY PCR: Toxigenic C. Difficile by PCR: NOT DETECTED

## 2014-11-09 LAB — GIARDIA ANTIGEN: GIARDIA SCREEN (EIA): NEGATIVE

## 2014-11-12 LAB — STOOL CULTURE

## 2014-11-14 NOTE — Progress Notes (Signed)
Quick Note:  Negative stool studies as expected. Continue with current plan. ______

## 2014-11-16 ENCOUNTER — Ambulatory Visit (HOSPITAL_COMMUNITY)
Admission: RE | Admit: 2014-11-16 | Discharge: 2014-11-16 | Disposition: A | Discharge status: Home / Self Care | Payer: Medicare Other | Source: Ambulatory Visit | Attending: Internal Medicine | Admitting: Internal Medicine

## 2014-11-16 ENCOUNTER — Telehealth: Payer: Self-pay | Admitting: Internal Medicine

## 2014-11-16 ENCOUNTER — Encounter (HOSPITAL_COMMUNITY)
Admission: RE | Disposition: A | Discharge status: Home / Self Care | Payer: Self-pay | Source: Ambulatory Visit | Attending: Internal Medicine

## 2014-11-16 ENCOUNTER — Encounter (HOSPITAL_COMMUNITY): Payer: Self-pay | Admitting: *Deleted

## 2014-11-16 DIAGNOSIS — F17210 Nicotine dependence, cigarettes, uncomplicated: Secondary | ICD-10-CM | POA: Insufficient documentation

## 2014-11-16 DIAGNOSIS — Z800 Family history of malignant neoplasm of digestive organs: Secondary | ICD-10-CM | POA: Diagnosis not present

## 2014-11-16 DIAGNOSIS — D123 Benign neoplasm of transverse colon: Secondary | ICD-10-CM | POA: Diagnosis not present

## 2014-11-16 DIAGNOSIS — K3184 Gastroparesis: Secondary | ICD-10-CM | POA: Insufficient documentation

## 2014-11-16 DIAGNOSIS — Z86010 Personal history of colonic polyps: Secondary | ICD-10-CM | POA: Diagnosis not present

## 2014-11-16 DIAGNOSIS — Z09 Encounter for follow-up examination after completed treatment for conditions other than malignant neoplasm: Secondary | ICD-10-CM | POA: Diagnosis present

## 2014-11-16 DIAGNOSIS — D120 Benign neoplasm of cecum: Secondary | ICD-10-CM | POA: Insufficient documentation

## 2014-11-16 DIAGNOSIS — K219 Gastro-esophageal reflux disease without esophagitis: Secondary | ICD-10-CM | POA: Diagnosis not present

## 2014-11-16 HISTORY — PX: COLONOSCOPY: SHX5424

## 2014-11-16 SURGERY — COLONOSCOPY
Anesthesia: Moderate Sedation

## 2014-11-16 MED ORDER — STERILE WATER FOR IRRIGATION IR SOLN
Status: DC | PRN
  Administered 2014-11-16: 13:00:00

## 2014-11-16 MED ORDER — MEPERIDINE HCL 100 MG/ML IJ SOLN
INTRAMUSCULAR | Status: DC | PRN
  Administered 2014-11-16 (×2): 50 mg via INTRAVENOUS

## 2014-11-16 MED ORDER — MIDAZOLAM HCL 5 MG/5ML IJ SOLN
INTRAMUSCULAR | Status: AC
  Filled 2014-11-16: qty 10

## 2014-11-16 MED ORDER — MEPERIDINE HCL 100 MG/ML IJ SOLN
INTRAMUSCULAR | Status: AC
  Filled 2014-11-16: qty 2

## 2014-11-16 MED ORDER — ONDANSETRON HCL 4 MG/2ML IJ SOLN
INTRAMUSCULAR | Status: AC
  Filled 2014-11-16: qty 2

## 2014-11-16 MED ORDER — SODIUM CHLORIDE 0.9 % IV SOLN
INTRAVENOUS | Status: DC
  Administered 2014-11-16: 12:00:00 via INTRAVENOUS

## 2014-11-16 MED ORDER — ONDANSETRON HCL 4 MG/2ML IJ SOLN
INTRAMUSCULAR | Status: DC | PRN
  Administered 2014-11-16: 4 mg via INTRAVENOUS

## 2014-11-16 MED ORDER — MIDAZOLAM HCL 5 MG/5ML IJ SOLN
INTRAMUSCULAR | Status: DC | PRN
  Administered 2014-11-16: 2 mg via INTRAVENOUS
  Administered 2014-11-16 (×2): 1 mg via INTRAVENOUS
  Administered 2014-11-16: 2 mg via INTRAVENOUS
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
## 2014-11-16 NOTE — Interval H&P Note (Signed)
History and Physical Interval Note:  11/16/2014 12:35 PM  [name]  has presented today for surgery, with the diagnosis of history of colon polyps  The various methods of treatment have been discussed with the patient and family. After consideration of risks, benefits and other options for treatment, the patient has consented to  Procedure(s) with comments: COLONOSCOPY (N/A) - 1230 as a surgical intervention .  The patient's history has been reviewed, patient examined, no change in status, stable for surgery.  I have reviewed the patient's chart and labs.  Questions were answered to the patient's satisfaction.     Robert Rourk  No change. Colonoscopy per plan.The risks, benefits, limitations, alternatives and imponderables have been reviewed with the patient. Questions have been answered. All parties are agreeable.

## 2014-11-16 NOTE — Telephone Encounter (Signed)
PATIENT CALLED INQUIRING ABOUT MEDICATIONS. GIVEN SAMPLES OF RESTORA LAST TIME HE WAS HERE AND WANTS TO KNOW IF HE CAN GET THIS OTC OR IF HE NEEDS A PRESCRIPTION  PLEASE ADVISE [phone]

## 2014-11-16 NOTE — Discharge Instructions (Signed)
°  Colonoscopy Discharge Instructions  Read the instructions outlined below and refer to this sheet in the next few weeks. These discharge instructions provide you with general information on caring for yourself after you leave the hospital. Your doctor may also give you specific instructions. While your treatment has been planned according to the most current medical practices available, unavoidable complications occasionally occur. If you have any problems or questions after discharge, call Dr. Gala Romney at [phone]. ACTIVITY  You may resume your regular activity, but move at a slower pace for the next 24 hours.   Take frequent rest periods for the next 24 hours.   Walking will help get rid of the air and reduce the bloated feeling in your belly (abdomen).   No driving for 24 hours (because of the medicine (anesthesia) used during the test).    Do not sign any important legal documents or operate any machinery for 24 hours (because of the anesthesia used during the test).  NUTRITION  Drink plenty of fluids.   You may resume your normal diet as instructed by your doctor.   Begin with a light meal and progress to your normal diet. Heavy or fried foods are harder to digest and may make you feel sick to your stomach (nauseated).   Avoid alcoholic beverages for 24 hours or as instructed.  MEDICATIONS  You may resume your normal medications unless your doctor tells you otherwise.  WHAT YOU CAN EXPECT TODAY  Some feelings of bloating in the abdomen.   Passage of more gas than usual.   Spotting of blood in your stool or on the toilet paper.  IF YOU HAD POLYPS REMOVED DURING THE COLONOSCOPY:  No aspirin products for 7 days or as instructed.   No alcohol for 7 days or as instructed.   Eat a soft diet for the next 24 hours.  FINDING OUT THE RESULTS OF YOUR TEST Not all test results are available during your visit. If your test results are not back during the visit, make an appointment  with your caregiver to find out the results. Do not assume everything is normal if you have not heard from your caregiver or the medical facility. It is important for you to follow up on all of your test results.  SEEK IMMEDIATE MEDICAL ATTENTION IF:  You have more than a spotting of blood in your stool.   Your belly is swollen (abdominal distention).   You are nauseated or vomiting.   You have a temperature over 101.   You have abdominal pain or discomfort that is severe or gets worse throughout the day.     Your colonoscopy preparation today was poor. Polyps were removed. You may have other polyps that could not be seen today  Further recommendations to follow pending review of pathology report  Office visit in 3 months to set up a repeat colonoscopy

## 2014-11-16 NOTE — Op Note (Signed)
[place], [id number]   COLONOSCOPY PROCEDURE REPORT  PATIENT: Gary [name], Gary [name]  MR#: [id number] BIRTHDATE: 1956/07/03 , 58  yrs. old GENDER: male ENDOSCOPIST: R.  Garfield Cornea, MD FACP [facility] REFERRED GE:[id number] Legrand Rams, M.D. PROCEDURE DATE:  Nov 19, 2014 PROCEDURE:   Colonoscopy with snare polypectomy INDICATIONS:History of multiple colonic adenomas removed previously; poor prep colonoscopy previously. MEDICATIONS: Versed 6 mg IV and Demerol 100 mg IV in divided doses. Zofran 4 mg IV. ASA CLASS:       Class II  CONSENT: The risks, benefits, alternatives and imponderables including but not limited to bleeding, perforation as well as the possibility of a missed lesion have been reviewed.  The potential for biopsy, lesion removal, etc. have also been discussed. Questions have been answered.  All parties agreeable.  Please see the history and physical in the medical record for more information.  DESCRIPTION OF PROCEDURE:   After the risks benefits and alternatives of the procedure were thoroughly explained, informed consent was obtained.  The digital rectal exam revealed no rectal mass.   The EC-[id number] ([id number])  endoscope was introduced through the anus and advanced to the cecum, which was identified by both the appendix and ileocecal valve. No adverse events experienced. The quality of the prep was inadequate  The instrument was then slowly withdrawn as the colon was fully examined.      COLON FINDINGS: (1) 5 mm polyp at the splenic flexure.  Patient had (2) polyps at the ileocecal valve.  One was approximately 1 x 1.25 cm it was a carpet polyp.  There was a second 8 mm polyp in the same area.  The remainder of the colonic mucosa that was seen appeared normal.  However,  it was a grossly inadequate preparation.  Good portions of the colonic mucosa could not be seen.  Examination of the rectal mucosa including a retroflexed verge  demonstrated no abnormalities.  The above-mentioned polyps were cold snare removed and hot snare removed -2, respectively.  Retroflexion was performed. .  Withdrawal time=8 minutes 0 seconds.  The scope was withdrawn and the procedure completed. COMPLICATIONS: There were no immediate complications.  ENDOSCOPIC IMPRESSION: Inadequate preparation precluded complete  examination of the colon. Multiple colonic polyps?"removed as described above.  RECOMMENDATIONS: Follow up on pathology. Office visit with [place] in 3 months to set up a repeat colonoscopy.  eSigned:  R. Garfield Cornea, MD Rosalita Chessman [facility] 11/19/2014 1:22 PM   cc:  CPT CODES: ICD CODES:  The ICD and CPT codes recommended by this software are interpretations from the data that the clinical staff has captured with the software.  The verification of the translation of this report to the ICD and CPT codes and modifiers is the sole responsibility of the health care institution and practicing physician where this report was generated.  [employer]. will not be held responsible for the validity of the ICD and CPT codes included on this report.  AMA assumes no liability for data contained or not contained herein. CPT is a Designer, television/film set of the [employer].  PATIENT NAME:  Gary [name], Gary [name] MR#: [id number]

## 2014-11-17 ENCOUNTER — Encounter: Payer: Self-pay | Admitting: Internal Medicine

## 2014-11-17 ENCOUNTER — Telehealth: Payer: Self-pay

## 2014-11-17 ENCOUNTER — Encounter (HOSPITAL_COMMUNITY): Payer: Self-pay | Admitting: Internal Medicine

## 2014-11-17 NOTE — Telephone Encounter (Signed)
Send letter to patient.  Send copy of letter with path to referring provider and PCP.   Will need propofol next time. Challenging prep.   Needs office visit in 3 months

## 2014-11-17 NOTE — Telephone Encounter (Signed)
Letter mailed to the pt. Please schedule ov in 3 months.

## 2014-11-18 LAB — PANCREATIC ELASTASE, FECAL: Pancreatic Elastase-1, Stool: 382 mcg/g

## 2014-11-18 NOTE — Telephone Encounter (Signed)
Spoke with the pt. Informed him that it was available otc but he may have to ask for it at the pharmacy counter. He said it was working well for him. I told them that the pharmacy may have to order it. He verbalized understanding.

## 2014-11-22 NOTE — Telephone Encounter (Signed)
APPOINTMENT MADE °

## 2014-11-23 ENCOUNTER — Telehealth: Payer: Self-pay | Admitting: Internal Medicine

## 2014-11-23 NOTE — Telephone Encounter (Signed)
Pt called this afternoon saying he had a colonoscopy done on 2/10 and was told he would hear about his results in 3 days. I told him that it takes 7-10 business days before we get the results back and signed off on, but he does need a 3 month follow up OV. He is aware of OV and please call him when his results are ready.

## 2014-11-23 NOTE — Progress Notes (Signed)
Quick Note:  No pancreatic insufficiency. ______

## 2014-11-24 NOTE — Telephone Encounter (Signed)
I spoke with the pt and informed him of what the letter said that [facility]MR sent. He said that he hasnt received it yet. I told him that I would mail him another copy. He is aware of his follow up appt.  Letter mailed to pt.

## 2014-12-27 ENCOUNTER — Encounter: Payer: Self-pay | Admitting: Internal Medicine

## 2015-02-08 ENCOUNTER — Ambulatory Visit: Payer: Medicare Other | Admitting: Gastroenterology

## 2015-02-14 ENCOUNTER — Encounter: Payer: Self-pay | Admitting: Gastroenterology

## 2015-02-14 ENCOUNTER — Telehealth: Payer: Self-pay | Admitting: Internal Medicine

## 2015-02-14 ENCOUNTER — Ambulatory Visit: Payer: Medicare Other | Admitting: Gastroenterology

## 2015-02-14 NOTE — Telephone Encounter (Signed)
PATIENT WAS A NO SHOW 02/14/15 AND LETTER WAS SENT

## 2015-02-16 ENCOUNTER — Other Ambulatory Visit: Payer: Self-pay

## 2015-02-17 ENCOUNTER — Telehealth: Payer: Self-pay | Admitting: Gastroenterology

## 2015-02-17 MED ORDER — ESOMEPRAZOLE MAGNESIUM 40 MG PO CPDR: 40.0000 mg | DELAYED_RELEASE_CAPSULE | Freq: Every day | ORAL | Status: DC

## 2015-02-17 NOTE — Telephone Encounter (Signed)
Patient needs to reschedule and keep OV. Needs repeat TCS due to poor bowel prep

## 2015-02-20 ENCOUNTER — Encounter: Payer: Self-pay | Admitting: Internal Medicine

## 2015-02-20 NOTE — Telephone Encounter (Signed)
APPOINTMENT MADE AND LETTER SENT °

## 2015-02-20 NOTE — Telephone Encounter (Signed)
Please schedule ov.  

## 2015-03-10 ENCOUNTER — Telehealth: Payer: Self-pay | Admitting: Gastroenterology

## 2015-03-10 ENCOUNTER — Ambulatory Visit: Payer: Medicare Other | Admitting: Gastroenterology

## 2015-03-10 NOTE — Telephone Encounter (Signed)
Pt was a no show

## 2015-04-03 ENCOUNTER — Encounter: Payer: Self-pay | Admitting: Nurse Practitioner

## 2015-04-03 ENCOUNTER — Ambulatory Visit (INDEPENDENT_AMBULATORY_CARE_PROVIDER_SITE_OTHER): Payer: Medicare Other | Admitting: Nurse Practitioner

## 2015-04-03 ENCOUNTER — Other Ambulatory Visit: Payer: Self-pay

## 2015-04-03 VITALS — BP 114/58 | HR 57 | Temp 98.4°F | Ht 69.0 in | Wt 152.2 lb

## 2015-04-03 DIAGNOSIS — Z860100 Personal history of colon polyps, unspecified: Secondary | ICD-10-CM

## 2015-04-03 MED ORDER — PEG 3350-KCL-NA BICARB-NACL 420 G PO SOLR: 4000.0000 mL | Freq: Once | ORAL | Status: DC

## 2015-04-03 NOTE — Patient Instructions (Signed)
1. We will schedule your repeat procedure for you. 2. Remember there'll be very detailed instructions on a different prep to help ensure that we can see everything during a repeat colonoscopy. 3. Further recommendations to be based on the results of your procedure.

## 2015-04-03 NOTE — Assessment & Plan Note (Addendum)
Patient with a history of multiple colonic adenomatous polyps. He is currently asymptomatic from a GI standpoint. Last colonoscopy 11/16/2014 due to this history which was completed with conscious sedation without complication. Patient states he has not had any issues with anesthesia or conscious sedation. Colonoscopy was completed despite grossly inadequate prep which precluded adequate visualization of the entire colonic mucosa. Despite this a multiple tubular adenoma polyps were removed. We will proceed with his short interval surveillance colonoscopy as recommended. Patient will be provided with a customized prep to promote adequate preparation and full examination of the colonic mucosa. This will include 2 days of clear liquids before the procedure as well as split dose prep the night before/morning of, and a half prep the night before that. This is in addition to the other standard instructions. Hopefully this will provide adequate clean out and allow full visualization.  Proceed with TCS with Dr. Gala Romney in near future: the risks, benefits, and alternatives have been discussed with the patient in detail. The patient states understanding and desires to proceed.  Patient is not on any antidepressants, anticoagulants, anxiolytics, her chronic pain medications. Denies alcohol and drug use. Previous colonoscopy within the last 6 months with conscious sedation and application. conscious sedation should be adequate for his procedure.

## 2015-04-03 NOTE — Progress Notes (Signed)
cc'd to pcp 
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
## 2015-04-17 ENCOUNTER — Encounter (HOSPITAL_COMMUNITY): Payer: Self-pay | Admitting: *Deleted

## 2015-04-17 ENCOUNTER — Encounter (HOSPITAL_COMMUNITY)
Admission: RE | Disposition: A | Discharge status: Home / Self Care | Payer: Self-pay | Source: Ambulatory Visit | Attending: Internal Medicine

## 2015-04-17 ENCOUNTER — Ambulatory Visit (HOSPITAL_COMMUNITY)
Admission: RE | Admit: 2015-04-17 | Discharge: 2015-04-17 | Disposition: A | Discharge status: Home / Self Care | Payer: Medicare Other | Source: Ambulatory Visit | Attending: Internal Medicine | Admitting: Internal Medicine

## 2015-04-17 DIAGNOSIS — Z79899 Other long term (current) drug therapy: Secondary | ICD-10-CM | POA: Diagnosis not present

## 2015-04-17 DIAGNOSIS — Z860100 Personal history of colon polyps, unspecified: Secondary | ICD-10-CM

## 2015-04-17 DIAGNOSIS — F17210 Nicotine dependence, cigarettes, uncomplicated: Secondary | ICD-10-CM | POA: Diagnosis not present

## 2015-04-17 DIAGNOSIS — D122 Benign neoplasm of ascending colon: Secondary | ICD-10-CM | POA: Diagnosis not present

## 2015-04-17 DIAGNOSIS — D124 Benign neoplasm of descending colon: Secondary | ICD-10-CM | POA: Insufficient documentation

## 2015-04-17 DIAGNOSIS — D120 Benign neoplasm of cecum: Secondary | ICD-10-CM | POA: Insufficient documentation

## 2015-04-17 DIAGNOSIS — Z09 Encounter for follow-up examination after completed treatment for conditions other than malignant neoplasm: Secondary | ICD-10-CM | POA: Diagnosis present

## 2015-04-17 HISTORY — PX: COLONOSCOPY: SHX5424

## 2015-04-17 SURGERY — COLONOSCOPY
Anesthesia: Moderate Sedation

## 2015-04-17 MED ORDER — MEPERIDINE HCL 100 MG/ML IJ SOLN
INTRAMUSCULAR | Status: DC | PRN
  Administered 2015-04-17 (×2): 50 mg via INTRAVENOUS

## 2015-04-17 MED ORDER — MIDAZOLAM HCL 5 MG/5ML IJ SOLN
INTRAMUSCULAR | Status: AC
  Filled 2015-04-17: qty 10

## 2015-04-17 MED ORDER — ONDANSETRON HCL 4 MG/2ML IJ SOLN
INTRAMUSCULAR | Status: DC | PRN
  Administered 2015-04-17: 4 mg via INTRAVENOUS

## 2015-04-17 MED ORDER — SODIUM CHLORIDE 0.9 % IV SOLN
INTRAVENOUS | Status: DC
  Administered 2015-04-17: 12:00:00 via INTRAVENOUS

## 2015-04-17 MED ORDER — ONDANSETRON HCL 4 MG/2ML IJ SOLN
INTRAMUSCULAR | Status: AC
  Filled 2015-04-17: qty 2

## 2015-04-17 MED ORDER — MIDAZOLAM HCL 5 MG/5ML IJ SOLN
INTRAMUSCULAR | Status: DC | PRN
  Administered 2015-04-17: 1 mg via INTRAVENOUS
  Administered 2015-04-17 (×2): 2 mg via INTRAVENOUS

## 2015-04-17 MED ORDER — STERILE WATER FOR IRRIGATION IR SOLN
Status: DC | PRN
  Administered 2015-04-17: 13:00:00

## 2015-04-17 MED ORDER — MEPERIDINE HCL 100 MG/ML IJ SOLN
INTRAMUSCULAR | Status: DC
Start: 2015-04-17 — End: 2015-04-17
  Filled 2015-04-17: qty 2
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
## 2015-04-17 NOTE — Op Note (Signed)
[place], [address]   COLONOSCOPY PROCEDURE REPORT  PATIENT: Gary, [name]  MR#: [id number] BIRTHDATE: 1955-11-07 , 65  yrs. old GENDER: male ENDOSCOPIST: R.  Garfield Cornea, MD FACP [facility] REFERRED IW:[id number] Legrand Rams, M.D. PROCEDURE DATE:  05/06/15 PROCEDURE:   Colonoscopy with snare polypectomy INDICATIONS:History of multiple colonic adenomas; inadequate preparation previously. MEDICATIONS: Versed 5 mg IV and Demerol 100 mg IV in divided doses. Zofran 4 mg IV. ASA CLASS:       Class II  CONSENT: The risks, benefits, alternatives and imponderables including but not limited to bleeding, perforation as well as the possibility of a missed lesion have been reviewed.  The potential for biopsy, lesion removal, etc. have also been discussed. Questions have been answered.  All parties agreeable.  Please see the history and physical in the medical record for more information.  DESCRIPTION OF PROCEDURE:   After the risks benefits and alternatives of the procedure were thoroughly explained, informed consent was obtained.  The digital rectal exam revealed no abnormalities of the rectum.   The EC-[id number] ([id number])  endoscope was introduced through the anus and advanced to the cecum, which was identified by both the appendix and ileocecal valve. No adverse events experienced.   The quality of the prep was inadequate  The instrument was then slowly withdrawn as the colon was fully examined. Estimated blood loss is zero unless otherwise noted in this procedure report.      COLON FINDINGS: Multiple cecal, ascending and descending colon polyps - largest 1 cm in the ascending segment.  Preparation was inadequate.  Smaller polyps or some larger polyps may have been obscured by the poor preparation.  Multiple hot and cold snare polypectomies performed.  All polyps seen were removed. EBL 5 mL Cecal withdrawal time 20 minutes.  Retroflexion was  performed. .  Withdrawal time=20 minutes 0 seconds.  The scope was withdrawn and the procedure completed. COMPLICATIONS: There were no immediate complications.  ENDOSCOPIC IMPRESSION: Multiple colonic polyps removed as described above.  Inadequate preparation precluded thorough examination.  RECOMMENDATIONS: Follow-up on pathology.  eSigned:  R. Garfield Cornea, MD [facility] 05/06/15 1:55 PM   cc:  CPT CODES: ICD CODES:  The ICD and CPT codes recommended by this software are interpretations from the data that the clinical staff has captured with the software.  The verification of the translation of this report to the ICD and CPT codes and modifiers is the sole responsibility of the health care institution and practicing physician where this report was generated.  [place]. will not be held responsible for the validity of the ICD and CPT codes included on this report.  AMA assumes no liability for data contained or not contained herein. CPT is a Designer, television/film set of the [employer].  PATIENT NAME:  Gary, [name] MR#: [id number]

## 2015-04-17 NOTE — Interval H&P Note (Signed)
History and Physical Interval Note:  04/17/2015 1:04 PM  [name]  has presented today for surgery, with the diagnosis of history of colon polyps  The various methods of treatment have been discussed with the patient and family. After consideration of risks, benefits and other options for treatment, the patient has consented to  Procedure(s) with comments: COLONOSCOPY (N/A) - 1245pm as a surgical intervention .  The patient's history has been reviewed, patient examined, no change in status, stable for surgery.  I have reviewed the patient's chart and labs.  Questions were answered to the patient's satisfaction.     Robert Rourk  No change surveillance colonoscopy for plan. The risks, benefits, limitations, alternatives and imponderables have been reviewed with the patient. Questions have been answered. All parties are agreeable.

## 2015-04-17 NOTE — Discharge Instructions (Signed)
Colonoscopy Discharge Instructions  Read the instructions outlined below and refer to this sheet in the next few weeks. These discharge instructions provide you with general information on caring for yourself after you leave the hospital. Your doctor may also give you specific instructions. While your treatment has been planned according to the most current medical practices available, unavoidable complications occasionally occur. If you have any problems or questions after discharge, call Dr. Gala Romney at [phone]. ACTIVITY  You may resume your regular activity, but move at a slower pace for the next 24 hours.   Take frequent rest periods for the next 24 hours.   Walking will help get rid of the air and reduce the bloated feeling in your belly (abdomen).   No driving for 24 hours (because of the medicine (anesthesia) used during the test).    Do not sign any important legal documents or operate any machinery for 24 hours (because of the anesthesia used during the test).  NUTRITION  Drink plenty of fluids.   You may resume your normal diet as instructed by your doctor.   Begin with a light meal and progress to your normal diet. Heavy or fried foods are harder to digest and may make you feel sick to your stomach (nauseated).   Avoid alcoholic beverages for 24 hours or as instructed.  MEDICATIONS  You may resume your normal medications unless your doctor tells you otherwise.  WHAT YOU CAN EXPECT TODAY  Some feelings of bloating in the abdomen.   Passage of more gas than usual.   Spotting of blood in your stool or on the toilet paper.  IF YOU HAD POLYPS REMOVED DURING THE COLONOSCOPY:  No aspirin products for 7 days or as instructed.   No alcohol for 7 days or as instructed.   Eat a soft diet for the next 24 hours.  FINDING OUT THE RESULTS OF YOUR TEST Not all test results are available during your visit. If your test results are not back during the visit, make an appointment  with your caregiver to find out the results. Do not assume everything is normal if you have not heard from your caregiver or the medical facility. It is important for you to follow up on all of your test results.  SEEK IMMEDIATE MEDICAL ATTENTION IF:  You have more than a spotting of blood in your stool.   Your belly is swollen (abdominal distention).   You are nauseated or vomiting.   You have a temperature over 101.   You have abdominal pain or discomfort that is severe or gets worse throughout the day.    Polyp information provided  You had multiple polyps.  Unfortunately, your preparation was again poor today.  Further recommendations to follow pending review of pathology report  Colon Polyps Polyps are lumps of extra tissue growing inside the body. Polyps can grow in the large intestine (colon). Most colon polyps are noncancerous (benign). However, some colon polyps can become cancerous over time. Polyps that are larger than a pea may be harmful. To be safe, caregivers remove and test all polyps. CAUSES  Polyps form when mutations in the genes cause your cells to grow and divide even though no more tissue is needed. RISK FACTORS There are a number of risk factors that can increase your chances of getting colon polyps. They include:  Being older than 50 years.  Family history of colon polyps or colon cancer.  Long-term colon diseases, such as colitis or Crohn disease.  Being overweight.  Smoking.  Being inactive.  Drinking too much alcohol. SYMPTOMS  Most small polyps do not cause symptoms. If symptoms are present, they may include:  Blood in the stool. The stool may look dark red or black.  Constipation or diarrhea that lasts longer than 1 week. DIAGNOSIS People often do not know they have polyps until their caregiver finds them during a regular checkup. Your caregiver can use 4 tests to check for polyps:  Digital rectal exam. The caregiver wears gloves and  feels inside the rectum. This test would find polyps only in the rectum.  Barium enema. The caregiver puts a liquid called barium into your rectum before taking X-rays of your colon. Barium makes your colon look white. Polyps are dark, so they are easy to see in the X-ray pictures.  Sigmoidoscopy. A thin, flexible tube (sigmoidoscope) is placed into your rectum. The sigmoidoscope has a light and tiny camera in it. The caregiver uses the sigmoidoscope to look at the last third of your colon.  Colonoscopy. This test is like sigmoidoscopy, but the caregiver looks at the entire colon. This is the most common method for finding and removing polyps. TREATMENT  Any polyps will be removed during a sigmoidoscopy or colonoscopy. The polyps are then tested for cancer. PREVENTION  To help lower your risk of getting more colon polyps:  Eat plenty of fruits and vegetables. Avoid eating fatty foods.  Do not smoke.  Avoid drinking alcohol.  Exercise every day.  Lose weight if recommended by your caregiver.  Eat plenty of calcium and folate. Foods that are rich in calcium include milk, cheese, and broccoli. Foods that are rich in folate include chickpeas, kidney beans, and spinach. HOME CARE INSTRUCTIONS Keep all follow-up appointments as directed by your caregiver. You may need periodic exams to check for polyps. SEEK MEDICAL CARE IF: You notice bleeding during a bowel movement. Document Released: 06/19/2004 Document Revised: 12/16/2011 Document Reviewed: 12/03/2011 [facility] Patient Information 2015 [place], [place]. This information is not intended to replace advice given to you by your health care provider. Make sure you discuss any questions you have with your health care provider.  Colonoscopy, Care After Refer to this sheet in the next few weeks. These instructions provide you with information on caring for yourself after your procedure. Your health care provider may also give you more specific  instructions. Your treatment has been planned according to current medical practices, but problems sometimes occur. Call your health care provider if you have any problems or questions after your procedure. WHAT TO EXPECT AFTER THE PROCEDURE  After your procedure, it is typical to have the following:  A small amount of blood in your stool.  Moderate amounts of gas and mild abdominal cramping or bloating. HOME CARE INSTRUCTIONS  Do not drive, operate machinery, or sign important documents for 24 hours.  You may shower and resume your regular physical activities, but move at a slower pace for the first 24 hours.  Take frequent rest periods for the first 24 hours.  Walk around or put a warm pack on your abdomen to help reduce abdominal cramping and bloating.  Drink enough fluids to keep your urine clear or pale yellow.  You may resume your normal diet as instructed by your health care provider. Avoid heavy or fried foods that are hard to digest.  Avoid drinking alcohol for 24 hours or as instructed by your health care provider.  Only take over-the-counter or prescription medicines as directed by  your health care provider.  If a tissue sample (biopsy) was taken during your procedure:  Do not take aspirin or blood thinners for 7 days, or as instructed by your health care provider.  Do not drink alcohol for 7 days, or as instructed by your health care provider.  Eat soft foods for the first 24 hours. SEEK MEDICAL CARE IF: You have persistent spotting of blood in your stool 2-3 days after the procedure. SEEK IMMEDIATE MEDICAL CARE IF:  You have more than a small spotting of blood in your stool.  You pass large blood clots in your stool.  Your abdomen is swollen (distended).  You have nausea or vomiting.  You have a fever.  You have increasing abdominal pain that is not relieved with medicine. Document Released: 05/07/2004 Document Revised: 07/14/2013 Document Reviewed:  05/31/2013 [facility] Patient Information 2015 [place], [place]. This information is not intended to replace advice given to you by your health care provider. Make sure you discuss any questions you have with your health care provider.

## 2015-04-18 ENCOUNTER — Encounter (HOSPITAL_COMMUNITY): Payer: Self-pay | Admitting: Internal Medicine

## 2015-04-22 ENCOUNTER — Encounter: Payer: Self-pay | Admitting: Internal Medicine

## 2015-04-25 ENCOUNTER — Ambulatory Visit: Payer: Medicare Other | Admitting: Gastroenterology

## 2015-07-11 ENCOUNTER — Encounter (HOSPITAL_COMMUNITY): Payer: Self-pay | Admitting: *Deleted

## 2015-07-11 ENCOUNTER — Ambulatory Visit (HOSPITAL_COMMUNITY)
Admission: RE | Admit: 2015-07-11 | Discharge: 2015-07-11 | Disposition: A | Discharge status: Home / Self Care | Payer: Medicare Other | Attending: Psychiatry | Admitting: Psychiatry

## 2015-07-11 DIAGNOSIS — F1420 Cocaine dependence, uncomplicated: Secondary | ICD-10-CM | POA: Diagnosis present

## 2015-07-11 DIAGNOSIS — F1210 Cannabis abuse, uncomplicated: Secondary | ICD-10-CM | POA: Insufficient documentation

## 2015-07-11 DIAGNOSIS — F17210 Nicotine dependence, cigarettes, uncomplicated: Secondary | ICD-10-CM | POA: Insufficient documentation

## 2015-07-11 HISTORY — DX: Depression, unspecified: F32.A

## 2015-07-11 HISTORY — DX: Major depressive disorder, single episode, unspecified: F32.9
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
## 2015-08-25 DIAGNOSIS — F17200 Nicotine dependence, unspecified, uncomplicated: Secondary | ICD-10-CM | POA: Diagnosis not present

## 2015-08-25 DIAGNOSIS — K219 Gastro-esophageal reflux disease without esophagitis: Secondary | ICD-10-CM | POA: Diagnosis not present

## 2015-08-25 DIAGNOSIS — Z0000 Encounter for general adult medical examination without abnormal findings: Secondary | ICD-10-CM | POA: Diagnosis not present

## 2015-08-25 DIAGNOSIS — Z23 Encounter for immunization: Secondary | ICD-10-CM | POA: Diagnosis not present

## 2015-08-25 DIAGNOSIS — I10 Essential (primary) hypertension: Secondary | ICD-10-CM | POA: Diagnosis not present

## 2015-08-25 DIAGNOSIS — M549 Dorsalgia, unspecified: Secondary | ICD-10-CM | POA: Diagnosis not present

## 2015-12-08 DIAGNOSIS — F17200 Nicotine dependence, unspecified, uncomplicated: Secondary | ICD-10-CM | POA: Diagnosis not present

## 2015-12-08 DIAGNOSIS — I10 Essential (primary) hypertension: Secondary | ICD-10-CM | POA: Diagnosis not present

## 2016-01-31 DIAGNOSIS — R0609 Other forms of dyspnea: Secondary | ICD-10-CM | POA: Diagnosis not present

## 2016-02-17 ENCOUNTER — Emergency Department (HOSPITAL_COMMUNITY)
Admission: EM | Admit: 2016-02-17 | Discharge: 2016-02-17 | Disposition: A | Discharge status: Home / Self Care | Payer: Medicare Other | Attending: Emergency Medicine | Admitting: Emergency Medicine

## 2016-02-17 ENCOUNTER — Encounter (HOSPITAL_COMMUNITY): Payer: Self-pay | Admitting: *Deleted

## 2016-02-17 DIAGNOSIS — F17210 Nicotine dependence, cigarettes, uncomplicated: Secondary | ICD-10-CM | POA: Insufficient documentation

## 2016-02-17 DIAGNOSIS — F329 Major depressive disorder, single episode, unspecified: Secondary | ICD-10-CM | POA: Diagnosis not present

## 2016-02-17 DIAGNOSIS — G8929 Other chronic pain: Secondary | ICD-10-CM | POA: Diagnosis not present

## 2016-02-17 DIAGNOSIS — M5441 Lumbago with sciatica, right side: Secondary | ICD-10-CM | POA: Diagnosis not present

## 2016-02-17 DIAGNOSIS — M545 Low back pain: Secondary | ICD-10-CM | POA: Diagnosis present

## 2016-02-17 MED ORDER — DICLOFENAC SODIUM 50 MG PO TBEC: 50.0000 mg | DELAYED_RELEASE_TABLET | Freq: Two times a day (BID) | ORAL | Status: DC

## 2016-02-17 MED ORDER — CYCLOBENZAPRINE HCL 10 MG PO TABS: 10.0000 mg | ORAL_TABLET | Freq: Two times a day (BID) | ORAL | Status: DC | PRN

## 2016-02-17 NOTE — Discharge Instructions (Signed)
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
## 2016-02-17 NOTE — ED Notes (Signed)
Pt comes in with lower back pain that moves into both hips. Pt denies any injury. Pt states this pain has persisted 4-5 months. He was seen here for the same in October. NAD noted. Pt is ambulatory.

## 2016-03-07 ENCOUNTER — Ambulatory Visit (HOSPITAL_COMMUNITY)
Admission: RE | Admit: 2016-03-07 | Discharge: 2016-03-07 | Disposition: A | Discharge status: Home / Self Care | Payer: Medicare Other | Source: Ambulatory Visit | Attending: Internal Medicine | Admitting: Internal Medicine

## 2016-03-07 ENCOUNTER — Other Ambulatory Visit (HOSPITAL_COMMUNITY): Payer: Self-pay | Admitting: Internal Medicine

## 2016-03-07 DIAGNOSIS — M25552 Pain in left hip: Secondary | ICD-10-CM

## 2016-03-07 DIAGNOSIS — I10 Essential (primary) hypertension: Secondary | ICD-10-CM | POA: Diagnosis not present

## 2016-03-07 DIAGNOSIS — J42 Unspecified chronic bronchitis: Secondary | ICD-10-CM | POA: Diagnosis not present

## 2016-03-07 DIAGNOSIS — M79606 Pain in leg, unspecified: Secondary | ICD-10-CM | POA: Diagnosis not present

## 2016-03-07 DIAGNOSIS — M5489 Other dorsalgia: Secondary | ICD-10-CM | POA: Diagnosis not present

## 2016-03-07 DIAGNOSIS — I739 Peripheral vascular disease, unspecified: Secondary | ICD-10-CM

## 2016-03-12 ENCOUNTER — Other Ambulatory Visit (HOSPITAL_COMMUNITY): Payer: Self-pay | Admitting: Internal Medicine

## 2016-03-12 ENCOUNTER — Ambulatory Visit (HOSPITAL_COMMUNITY)
Admission: RE | Admit: 2016-03-12 | Discharge: 2016-03-12 | Disposition: A | Discharge status: Home / Self Care | Payer: Medicare Other | Source: Ambulatory Visit | Attending: Internal Medicine | Admitting: Internal Medicine

## 2016-03-12 DIAGNOSIS — I739 Peripheral vascular disease, unspecified: Secondary | ICD-10-CM

## 2016-03-12 DIAGNOSIS — I70213 Atherosclerosis of native arteries of extremities with intermittent claudication, bilateral legs: Secondary | ICD-10-CM | POA: Diagnosis not present

## 2016-03-12 DIAGNOSIS — I771 Stricture of artery: Secondary | ICD-10-CM | POA: Diagnosis not present

## 2016-03-13 ENCOUNTER — Encounter: Payer: Self-pay | Admitting: Internal Medicine

## 2016-03-13 ENCOUNTER — Ambulatory Visit (INDEPENDENT_AMBULATORY_CARE_PROVIDER_SITE_OTHER): Payer: Medicare Other | Admitting: Orthopaedic Surgery

## 2016-03-13 ENCOUNTER — Encounter: Payer: Self-pay | Admitting: Orthopaedic Surgery

## 2016-03-13 ENCOUNTER — Ambulatory Visit (INDEPENDENT_AMBULATORY_CARE_PROVIDER_SITE_OTHER): Payer: Medicare Other

## 2016-03-13 VITALS — BP 149/76 | HR 68 | Temp 97.7°F | Ht 70.5 in | Wt 163.0 lb

## 2016-03-13 DIAGNOSIS — M5442 Lumbago with sciatica, left side: Secondary | ICD-10-CM

## 2016-03-13 MED ORDER — HYDROCODONE-ACETAMINOPHEN 5-325 MG PO TABS: 1.0000 | ORAL_TABLET | ORAL | Status: DC | PRN

## 2016-03-13 MED ORDER — CYCLOBENZAPRINE HCL 10 MG PO TABS: 10.0000 mg | ORAL_TABLET | Freq: Three times a day (TID) | ORAL | Status: DC | PRN

## 2016-03-13 MED ORDER — NAPROXEN 500 MG PO TABS: 500.0000 mg | ORAL_TABLET | Freq: Two times a day (BID) | ORAL | Status: DC
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
## 2016-03-19 ENCOUNTER — Encounter: Payer: Self-pay | Admitting: Vascular Surgery

## 2016-03-21 ENCOUNTER — Ambulatory Visit (HOSPITAL_COMMUNITY)
Admission: RE | Admit: 2016-03-21 | Discharge: 2016-03-21 | Disposition: A | Discharge status: Home / Self Care | Payer: Medicare Other | Source: Ambulatory Visit | Attending: Orthopaedic Surgery | Admitting: Orthopaedic Surgery

## 2016-03-21 ENCOUNTER — Encounter: Payer: Self-pay | Admitting: Vascular Surgery

## 2016-03-21 DIAGNOSIS — I745 Embolism and thrombosis of iliac artery: Secondary | ICD-10-CM | POA: Diagnosis not present

## 2016-03-21 DIAGNOSIS — I700 Atherosclerosis of aorta: Secondary | ICD-10-CM | POA: Diagnosis not present

## 2016-03-21 DIAGNOSIS — M4806 Spinal stenosis, lumbar region: Secondary | ICD-10-CM | POA: Diagnosis not present

## 2016-03-21 DIAGNOSIS — M545 Low back pain: Secondary | ICD-10-CM | POA: Diagnosis not present

## 2016-03-21 DIAGNOSIS — M5442 Lumbago with sciatica, left side: Secondary | ICD-10-CM | POA: Diagnosis not present

## 2016-03-26 ENCOUNTER — Encounter: Payer: Self-pay | Admitting: Orthopaedic Surgery

## 2016-03-26 ENCOUNTER — Ambulatory Visit (INDEPENDENT_AMBULATORY_CARE_PROVIDER_SITE_OTHER): Payer: Medicare Other | Admitting: Orthopaedic Surgery

## 2016-03-26 VITALS — BP 134/64 | HR 76 | Ht 70.5 in | Wt 163.0 lb

## 2016-03-26 DIAGNOSIS — M5442 Lumbago with sciatica, left side: Secondary | ICD-10-CM

## 2016-03-26 MED ORDER — HYDROCODONE-ACETAMINOPHEN 5-325 MG PO TABS: 1.0000 | ORAL_TABLET | ORAL | Status: DC | PRN

## 2016-03-26 NOTE — Patient Instructions (Signed)
Discussed walking in a pool for exercise.
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
## 2016-03-27 ENCOUNTER — Encounter: Payer: Self-pay | Admitting: Vascular Surgery

## 2016-04-02 ENCOUNTER — Ambulatory Visit (INDEPENDENT_AMBULATORY_CARE_PROVIDER_SITE_OTHER): Payer: Medicare Other | Admitting: Vascular Surgery

## 2016-04-02 VITALS — BP 155/77 | HR 84 | Temp 97.3°F | Resp 16 | Ht 69.0 in | Wt 160.0 lb

## 2016-04-02 DIAGNOSIS — I739 Peripheral vascular disease, unspecified: Secondary | ICD-10-CM | POA: Insufficient documentation

## 2016-04-02 NOTE — Progress Notes (Signed)
Filed Vitals:   04/02/16 1508 04/02/16 1511  BP: 153/80 155/77  Pulse: 84 84  Temp: 97.3 F (36.3 C)   Resp: 16   Height: 5\' 9"  (1.753 m)   Weight: 160 lb (72.576 kg)   SpO2: 100%
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
## 2016-04-03 ENCOUNTER — Telehealth: Payer: Self-pay | Admitting: Vascular Surgery

## 2016-04-03 NOTE — Telephone Encounter (Signed)
Spoke with dtr billie joe - CTA 04/16/16 301 at 9:30 am - no solids 4 hrs prior See Dr. Donnetta Hutching 04/16/16 at 11:15 am She verbalized understanding.

## 2016-04-03 NOTE — Addendum Note (Signed)
Addended by: Reola Calkins on: 04/03/2016 09:53 AM   Modules accepted: Orders

## 2016-04-05 ENCOUNTER — Encounter: Payer: Self-pay | Admitting: Vascular Surgery

## 2016-04-16 ENCOUNTER — Other Ambulatory Visit: Payer: Self-pay

## 2016-04-16 ENCOUNTER — Ambulatory Visit
Admission: RE | Admit: 2016-04-16 | Discharge: 2016-04-16 | Disposition: A | Discharge status: Home / Self Care | Payer: Medicare Other | Source: Ambulatory Visit | Attending: Vascular Surgery | Admitting: Vascular Surgery

## 2016-04-16 ENCOUNTER — Ambulatory Visit (INDEPENDENT_AMBULATORY_CARE_PROVIDER_SITE_OTHER): Payer: Medicare Other | Admitting: Vascular Surgery

## 2016-04-16 ENCOUNTER — Encounter: Payer: Self-pay | Admitting: Vascular Surgery

## 2016-04-16 VITALS — BP 151/74 | HR 59 | Temp 97.7°F | Resp 18 | Ht 69.0 in | Wt 160.0 lb

## 2016-04-16 DIAGNOSIS — I724 Aneurysm of artery of lower extremity: Secondary | ICD-10-CM | POA: Diagnosis not present

## 2016-04-16 DIAGNOSIS — I739 Peripheral vascular disease, unspecified: Secondary | ICD-10-CM | POA: Diagnosis not present

## 2016-04-16 MED ORDER — IOPAMIDOL (ISOVUE-370) INJECTION 76%
100.0000 mL | Freq: Once | INTRAVENOUS | Status: AC | PRN
  Administered 2016-04-16: 100 mL via INTRAVENOUS
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
## 2016-04-22 ENCOUNTER — Telehealth: Payer: Self-pay

## 2016-04-22 ENCOUNTER — Telehealth: Payer: Self-pay | Admitting: Orthopaedic Surgery

## 2016-04-22 NOTE — Telephone Encounter (Signed)
Patient called for refill of: HYDROcodone-acetaminophen (NORCO/VICODIN) 5-325 MG tablet [id number] - quantity 120.

## 2016-04-22 NOTE — Telephone Encounter (Signed)
Phone call from pt. with request for refill on pain medication.  Advised that Dr. Donnetta Hutching not available at this time, and wouldn't usually prescribe narcotic pain medication until pt. Has had surgery.  Suggested that he contact Dr. Luna Glasgow re: refill.  Pt. Verb. Understanding.

## 2016-04-23 ENCOUNTER — Ambulatory Visit (INDEPENDENT_AMBULATORY_CARE_PROVIDER_SITE_OTHER): Payer: Medicare Other | Admitting: Cardiology

## 2016-04-23 ENCOUNTER — Encounter: Payer: Self-pay | Admitting: *Deleted

## 2016-04-23 ENCOUNTER — Encounter: Payer: Self-pay | Admitting: Cardiology

## 2016-04-23 VITALS — BP 140/62 | HR 66 | Ht 69.0 in | Wt 151.0 lb

## 2016-04-23 DIAGNOSIS — Z01810 Encounter for preprocedural cardiovascular examination: Secondary | ICD-10-CM

## 2016-04-23 DIAGNOSIS — Z136 Encounter for screening for cardiovascular disorders: Secondary | ICD-10-CM

## 2016-04-23 DIAGNOSIS — I739 Peripheral vascular disease, unspecified: Secondary | ICD-10-CM

## 2016-04-23 DIAGNOSIS — R0609 Other forms of dyspnea: Secondary | ICD-10-CM

## 2016-04-23 MED ORDER — HYDROCODONE-ACETAMINOPHEN 5-325 MG PO TABS: 1.0000 | ORAL_TABLET | ORAL | Status: DC | PRN

## 2016-04-23 NOTE — Patient Instructions (Signed)
Your physician recommends that you schedule a follow-up appointment TO BE DETERMINED AFTER TESTING  Your physician has recommended you make the following change in your medication:   START OTC ASPIRIN 81 MG DAILY  Your physician has requested that you have a lexiscan myoview. For further information please visit [email or web address]. Please follow instruction sheet, as given.  Thank you for choosing [facility]!!

## 2016-04-23 NOTE — Telephone Encounter (Signed)
Rx done. 
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
## 2016-05-01 ENCOUNTER — Encounter (HOSPITAL_COMMUNITY)
Admission: RE | Admit: 2016-05-01 | Discharge: 2016-05-01 | Disposition: A | Discharge status: Home / Self Care | Payer: Medicare Other | Source: Ambulatory Visit | Attending: Cardiology | Admitting: Cardiology

## 2016-05-01 ENCOUNTER — Encounter (HOSPITAL_COMMUNITY): Payer: Self-pay

## 2016-05-01 ENCOUNTER — Inpatient Hospital Stay (HOSPITAL_COMMUNITY): Admission: RE | Admit: 2016-05-01 | Payer: Self-pay | Source: Ambulatory Visit

## 2016-05-01 DIAGNOSIS — R0609 Other forms of dyspnea: Secondary | ICD-10-CM | POA: Insufficient documentation

## 2016-05-01 LAB — NM MYOCAR MULTI W/SPECT W/WALL MOTION / EF
CHL CUP NUCLEAR SSS: 3
LV sys vol: 31 mL
LVDIAVOL: 100 mL (ref 62–150)
NUC STRESS TID: 1.14
Peak HR: 76 {beats}/min
RATE: 0.33
Rest HR: 51 {beats}/min
SDS: 1
SRS: 2

## 2016-05-01 MED ORDER — REGADENOSON 0.4 MG/5ML IV SOLN
INTRAVENOUS | Status: AC
  Administered 2016-05-01: 0.4 mg via INTRAVENOUS
  Filled 2016-05-01: qty 5

## 2016-05-01 MED ORDER — TECHNETIUM TC 99M TETROFOSMIN IV KIT
10.0000 | PACK | Freq: Once | INTRAVENOUS | Status: AC | PRN
  Administered 2016-05-01: 9.3 via INTRAVENOUS

## 2016-05-01 MED ORDER — TECHNETIUM TC 99M TETROFOSMIN IV KIT
30.0000 | PACK | Freq: Once | INTRAVENOUS | Status: AC | PRN
  Administered 2016-05-01: 30 via INTRAVENOUS

## 2016-05-01 MED ORDER — SODIUM CHLORIDE 0.9% FLUSH
INTRAVENOUS | Status: AC
  Administered 2016-05-01: 10 mL via INTRAVENOUS
  Filled 2016-05-01: qty 10

## 2016-05-02 ENCOUNTER — Encounter (HOSPITAL_COMMUNITY): Payer: Self-pay

## 2016-05-02 ENCOUNTER — Encounter (HOSPITAL_COMMUNITY)
Admission: RE | Admit: 2016-05-02 | Discharge: 2016-05-02 | Disposition: A | Discharge status: Home / Self Care | Payer: Medicare Other | Source: Ambulatory Visit | Attending: Vascular Surgery | Admitting: Vascular Surgery

## 2016-05-02 DIAGNOSIS — Z01812 Encounter for preprocedural laboratory examination: Secondary | ICD-10-CM | POA: Diagnosis not present

## 2016-05-02 DIAGNOSIS — Z0183 Encounter for blood typing: Secondary | ICD-10-CM | POA: Insufficient documentation

## 2016-05-02 DIAGNOSIS — Z7982 Long term (current) use of aspirin: Secondary | ICD-10-CM | POA: Insufficient documentation

## 2016-05-02 DIAGNOSIS — I739 Peripheral vascular disease, unspecified: Secondary | ICD-10-CM | POA: Diagnosis not present

## 2016-05-02 DIAGNOSIS — Z01818 Encounter for other preprocedural examination: Secondary | ICD-10-CM | POA: Insufficient documentation

## 2016-05-02 HISTORY — DX: Pneumonia, unspecified organism: J18.9

## 2016-05-02 HISTORY — DX: Unspecified osteoarthritis, unspecified site: M19.90

## 2016-05-02 LAB — BLOOD GAS, ARTERIAL
ACID-BASE EXCESS: 2.9 mmol/L — AB (ref 0.0–2.0)
Bicarbonate: 27.1 mEq/L — ABNORMAL HIGH (ref 20.0–24.0)
DRAWN BY: 42180
FIO2: 0.21
O2 SAT: 98.1 %
PCO2 ART: 42.6 mmHg (ref 35.0–45.0)
PO2 ART: 107 mmHg — AB (ref 80.0–100.0)
Patient temperature: 98.6
TCO2: 28.4 mmol/L (ref 0–100)
pH, Arterial: 7.42 (ref 7.350–7.450)

## 2016-05-02 LAB — CBC
HCT: 40.7 % (ref 39.0–52.0)
Hemoglobin: 13.2 g/dL (ref 13.0–17.0)
MCH: 29.1 pg (ref 26.0–34.0)
MCHC: 32.4 g/dL (ref 30.0–36.0)
MCV: 89.6 fL (ref 78.0–100.0)
PLATELETS: 197 10*3/uL (ref 150–400)
RBC: 4.54 MIL/uL (ref 4.22–5.81)
RDW: 14 % (ref 11.5–15.5)
WBC: 8.5 10*3/uL (ref 4.0–10.5)

## 2016-05-02 LAB — COMPREHENSIVE METABOLIC PANEL
ALT: 16 U/L — ABNORMAL LOW (ref 17–63)
ANION GAP: 7 (ref 5–15)
AST: 16 U/L (ref 15–41)
Albumin: 4 g/dL (ref 3.5–5.0)
Alkaline Phosphatase: 36 U/L — ABNORMAL LOW (ref 38–126)
BUN: 12 mg/dL (ref 6–20)
CHLORIDE: 106 mmol/L (ref 101–111)
CO2: 26 mmol/L (ref 22–32)
CREATININE: 0.85 mg/dL (ref 0.61–1.24)
Calcium: 9.5 mg/dL (ref 8.9–10.3)
Glucose, Bld: 112 mg/dL — ABNORMAL HIGH (ref 65–99)
POTASSIUM: 3.1 mmol/L — AB (ref 3.5–5.1)
SODIUM: 139 mmol/L (ref 135–145)
Total Bilirubin: 0.6 mg/dL (ref 0.3–1.2)
Total Protein: 6.9 g/dL (ref 6.5–8.1)

## 2016-05-02 LAB — ABO/RH: ABO/RH(D): A NEG

## 2016-05-02 LAB — URINALYSIS, ROUTINE W REFLEX MICROSCOPIC
GLUCOSE, UA: NEGATIVE mg/dL
HGB URINE DIPSTICK: NEGATIVE
KETONES UR: NEGATIVE mg/dL
LEUKOCYTES UA: NEGATIVE
Nitrite: NEGATIVE
PH: 6 (ref 5.0–8.0)
PROTEIN: NEGATIVE mg/dL
Specific Gravity, Urine: 1.035 — ABNORMAL HIGH (ref 1.005–1.030)

## 2016-05-02 LAB — PROTIME-INR
INR: 1.05
PROTHROMBIN TIME: 13.8 s (ref 11.4–15.2)

## 2016-05-02 LAB — SURGICAL PCR SCREEN
MRSA, PCR: NEGATIVE
Staphylococcus aureus: NEGATIVE

## 2016-05-02 LAB — APTT: APTT: 31 s (ref 24–36)

## 2016-05-02 NOTE — Pre-Procedure Instructions (Signed)
Gary [name]  05/02/2016      [facility] [place], [place] - [id number] [place] [address] Phone: [phone] Fax: [phone]  [place], [place] [address] Phone: [phone] Fax: [phone]    Your procedure is scheduled on 05/10/2016.  Report to [facility] Admitting at 5:30 A.M.  Call this number if you have problems the morning of surgery:  [phone]   Remember:  Do not eat food or drink liquids after midnight.  On Thursday  05/09/2016   Take these medicines the morning of surgery with A SIP OF WATER : pain medicine- HYDROCODONE   Do not wear jewelry   Do not wear lotions, powders, or perfumes.      Men may shave face and neck   Do not bring valuables to the hospital.   [facility] is not responsible for any belongings or valuables.  Contacts, dentures or bridgework may not be worn into surgery.  Leave your suitcase in the car.  After surgery it may be brought to your room.  For patients admitted to the hospital, discharge time will be determined by your treatment team.  Patients discharged the day of surgery will not be allowed to drive home.   Name and phone number of your driver:   With family  Special instructions:  Special Instructions: [place] - Preparing for Surgery  Before surgery, you can play an important role.  Because skin is not sterile, your skin needs to be as free of germs as possible.  You can reduce the number of germs on you skin by washing with CHG (chlorahexidine gluconate) soap before surgery.  CHG is an antiseptic cleaner which kills germs and bonds with the skin to continue killing germs even after washing.  Please DO NOT use if you have an allergy to CHG or antibacterial soaps.  If your skin becomes reddened/irritated stop using the CHG and inform your nurse when you arrive at Short Stay.  Do not shave (including  legs and underarms) for at least 48 hours prior to the first CHG shower.  You may shave your face.  Please follow these instructions carefully:   1.  Shower with CHG Soap the night before surgery and the  morning of Surgery.  2.  If you choose to wash your hair, wash your hair first as usual with your  normal shampoo.  3.  After you shampoo, rinse your hair and body thoroughly to remove the  Shampoo.  4.  Use CHG as you would any other liquid soap.  You can apply chg directly to the skin and wash gently with scrungie or a clean washcloth.  5.  Apply the CHG Soap to your body ONLY FROM THE NECK DOWN.    Do not use on open wounds or open sores.  Avoid contact with your eyes, ears, mouth and genitals (private parts).  Wash genitals (private parts)   with your normal soap.  6.  Wash thoroughly, paying special attention to the area where your surgery will be performed.  7.  Thoroughly rinse your body with warm water from the neck down.  8.  DO NOT shower/wash with your normal soap after using and rinsing off   the CHG Soap.  9.  Pat yourself dry with a clean towel.            10.  Wear clean pajamas.            11.  Place clean sheets on your bed the night of your first shower and do not sleep with pets.  Day of Surgery  Do not apply any lotions/deodorants the morning of surgery.  Please wear clean clothes to the hospital/surgery center.  Please read over the following fact sheets that you were given. Pain Booklet, Coughing and Deep Breathing, MRSA Information and Surgical Site Infection Prevention

## 2016-05-02 NOTE — Progress Notes (Signed)
Pt. Reports that he is followed by Dr. Legrand Rams, for PCP, referred to Dr. Harl Bowie for cardiac clearance, stress test completed on 05/01/2016, awaiting results. Pt. Denies all chest concerns.
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
## 2016-05-10 ENCOUNTER — Inpatient Hospital Stay (HOSPITAL_COMMUNITY): Payer: Medicare Other

## 2016-05-10 ENCOUNTER — Encounter (HOSPITAL_COMMUNITY)
Admission: RE | Disposition: A | Discharge status: Home / Self Care | Payer: Self-pay | Source: Ambulatory Visit | Attending: Vascular Surgery

## 2016-05-10 ENCOUNTER — Inpatient Hospital Stay (HOSPITAL_COMMUNITY): Payer: Medicare Other | Admitting: Vascular Surgery

## 2016-05-10 ENCOUNTER — Inpatient Hospital Stay (HOSPITAL_COMMUNITY)
Admission: RE | Admit: 2016-05-10 | Discharge: 2016-05-14 | DRG: 272 | Disposition: A | Discharge status: Home / Self Care | Payer: Medicare Other | Source: Ambulatory Visit | Attending: Vascular Surgery | Admitting: Vascular Surgery

## 2016-05-10 ENCOUNTER — Inpatient Hospital Stay (HOSPITAL_COMMUNITY): Payer: Medicare Other | Admitting: Certified Registered Nurse Anesthetist

## 2016-05-10 DIAGNOSIS — I739 Peripheral vascular disease, unspecified: Secondary | ICD-10-CM | POA: Diagnosis present

## 2016-05-10 DIAGNOSIS — I2510 Atherosclerotic heart disease of native coronary artery without angina pectoris: Secondary | ICD-10-CM | POA: Diagnosis not present

## 2016-05-10 DIAGNOSIS — F17210 Nicotine dependence, cigarettes, uncomplicated: Secondary | ICD-10-CM | POA: Diagnosis present

## 2016-05-10 DIAGNOSIS — M79605 Pain in left leg: Secondary | ICD-10-CM | POA: Diagnosis not present

## 2016-05-10 DIAGNOSIS — K660 Peritoneal adhesions (postprocedural) (postinfection): Secondary | ICD-10-CM | POA: Diagnosis not present

## 2016-05-10 DIAGNOSIS — I7409 Other arterial embolism and thrombosis of abdominal aorta: Secondary | ICD-10-CM | POA: Diagnosis not present

## 2016-05-10 DIAGNOSIS — Z95828 Presence of other vascular implants and grafts: Secondary | ICD-10-CM

## 2016-05-10 DIAGNOSIS — J9811 Atelectasis: Secondary | ICD-10-CM | POA: Diagnosis not present

## 2016-05-10 DIAGNOSIS — I700 Atherosclerosis of aorta: Secondary | ICD-10-CM | POA: Diagnosis not present

## 2016-05-10 HISTORY — PX: AORTA - BILATERAL FEMORAL ARTERY BYPASS GRAFT: SHX1175

## 2016-05-10 LAB — CBC
HCT: 35.5 % — ABNORMAL LOW (ref 39.0–52.0)
Hemoglobin: 11.4 g/dL — ABNORMAL LOW (ref 13.0–17.0)
MCH: 29.5 pg (ref 26.0–34.0)
MCHC: 32.1 g/dL (ref 30.0–36.0)
MCV: 91.7 fL (ref 78.0–100.0)
PLATELETS: 164 10*3/uL (ref 150–400)
RBC: 3.87 MIL/uL — AB (ref 4.22–5.81)
RDW: 14.4 % (ref 11.5–15.5)
WBC: 16.4 10*3/uL — AB (ref 4.0–10.5)

## 2016-05-10 LAB — PREPARE RBC (CROSSMATCH)

## 2016-05-10 LAB — BASIC METABOLIC PANEL
ANION GAP: 6 (ref 5–15)
BUN: 13 mg/dL (ref 6–20)
CALCIUM: 8.2 mg/dL — AB (ref 8.9–10.3)
CO2: 24 mmol/L (ref 22–32)
Chloride: 107 mmol/L (ref 101–111)
Creatinine, Ser: 0.59 mg/dL — ABNORMAL LOW (ref 0.61–1.24)
Glucose, Bld: 148 mg/dL — ABNORMAL HIGH (ref 65–99)
POTASSIUM: 4.3 mmol/L (ref 3.5–5.1)
SODIUM: 137 mmol/L (ref 135–145)

## 2016-05-10 LAB — BLOOD GAS, ARTERIAL
Acid-base deficit: 0.7 mmol/L (ref 0.0–2.0)
BICARBONATE: 24.5 meq/L — AB (ref 20.0–24.0)
O2 Content: 3 L/min
O2 SAT: 93 %
PATIENT TEMPERATURE: 97.8
PO2 ART: 70.2 mmHg — AB (ref 80.0–100.0)
TCO2: 26 mmol/L (ref 0–100)
pCO2 arterial: 46.9 mmHg — ABNORMAL HIGH (ref 35.0–45.0)
pH, Arterial: 7.335 — ABNORMAL LOW (ref 7.350–7.450)

## 2016-05-10 LAB — MAGNESIUM: MAGNESIUM: 1.6 mg/dL — AB (ref 1.7–2.4)

## 2016-05-10 LAB — APTT: APTT: 29 s (ref 24–36)

## 2016-05-10 LAB — PROTIME-INR
INR: 1.15
Prothrombin Time: 14.7 seconds (ref 11.4–15.2)

## 2016-05-10 SURGERY — CREATION, BYPASS, ARTERIAL, AORTA TO FEMORAL, BILATERAL, USING GRAFT
Anesthesia: General | Site: Abdomen | Laterality: Bilateral

## 2016-05-10 MED ORDER — ENOXAPARIN SODIUM 30 MG/0.3ML ~~LOC~~ SOLN
30.0000 mg | SUBCUTANEOUS | Status: DC
  Administered 2016-05-11 – 2016-05-13 (×3): 30 mg via SUBCUTANEOUS
  Filled 2016-05-10 (×3): qty 0.3

## 2016-05-10 MED ORDER — FAMOTIDINE IN NACL 20-0.9 MG/50ML-% IV SOLN
20.0000 mg | Freq: Two times a day (BID) | INTRAVENOUS | Status: DC
  Administered 2016-05-10 – 2016-05-12 (×5): 20 mg via INTRAVENOUS
  Filled 2016-05-10 (×6): qty 50

## 2016-05-10 MED ORDER — FENTANYL CITRATE (PF) 250 MCG/5ML IJ SOLN
INTRAMUSCULAR | Status: AC
  Filled 2016-05-10: qty 5

## 2016-05-10 MED ORDER — SODIUM CHLORIDE 0.9 % IV SOLN: INTRAVENOUS | Status: DC

## 2016-05-10 MED ORDER — CHLORHEXIDINE GLUCONATE CLOTH 2 % EX PADS: 6.0000 | MEDICATED_PAD | Freq: Once | CUTANEOUS | Status: DC

## 2016-05-10 MED ORDER — SODIUM CHLORIDE 0.9 % IV SOLN
INTRAVENOUS | Status: DC | PRN
  Administered 2016-05-10: 500 mL

## 2016-05-10 MED ORDER — CEFUROXIME SODIUM 1.5 G IJ SOLR
INTRAMUSCULAR | Status: AC
  Filled 2016-05-10: qty 1.5

## 2016-05-10 MED ORDER — PHENYLEPHRINE 40 MCG/ML (10ML) SYRINGE FOR IV PUSH (FOR BLOOD PRESSURE SUPPORT)
PREFILLED_SYRINGE | INTRAVENOUS | Status: AC
  Filled 2016-05-10: qty 10

## 2016-05-10 MED ORDER — HYDRALAZINE HCL 20 MG/ML IJ SOLN
5.0000 mg | INTRAMUSCULAR | Status: DC | PRN
Start: 2016-05-10 — End: 2016-05-14

## 2016-05-10 MED ORDER — PROPOFOL 10 MG/ML IV BOLUS
INTRAVENOUS | Status: AC
  Filled 2016-05-10: qty 20

## 2016-05-10 MED ORDER — METOPROLOL TARTRATE 5 MG/5ML IV SOLN
2.5000 mg | Freq: Four times a day (QID) | INTRAVENOUS | Status: DC
  Administered 2016-05-10 – 2016-05-14 (×12): 2.5 mg via INTRAVENOUS
  Filled 2016-05-10 (×13): qty 5

## 2016-05-10 MED ORDER — PANTOPRAZOLE SODIUM 40 MG PO TBEC
40.0000 mg | DELAYED_RELEASE_TABLET | Freq: Every day | ORAL | Status: DC
  Administered 2016-05-12 – 2016-05-13 (×2): 40 mg via ORAL
  Filled 2016-05-10 (×2): qty 1

## 2016-05-10 MED ORDER — LABETALOL HCL 5 MG/ML IV SOLN
INTRAVENOUS | Status: DC | PRN
  Administered 2016-05-10 (×4): 5 mg via INTRAVENOUS

## 2016-05-10 MED ORDER — DOCUSATE SODIUM 100 MG PO CAPS
100.0000 mg | ORAL_CAPSULE | Freq: Every day | ORAL | Status: DC
  Administered 2016-05-12 – 2016-05-13 (×2): 100 mg via ORAL
  Filled 2016-05-10 (×2): qty 1

## 2016-05-10 MED ORDER — POTASSIUM CHLORIDE CRYS ER 20 MEQ PO TBCR: 20.0000 meq | EXTENDED_RELEASE_TABLET | Freq: Once | ORAL | Status: DC | PRN

## 2016-05-10 MED ORDER — ROCURONIUM BROMIDE 50 MG/5ML IV SOLN
INTRAVENOUS | Status: AC
  Filled 2016-05-10: qty 2

## 2016-05-10 MED ORDER — KCL IN DEXTROSE-NACL 20-5-0.45 MEQ/L-%-% IV SOLN
INTRAVENOUS | Status: DC
  Administered 2016-05-10 – 2016-05-11 (×2): via INTRAVENOUS
  Administered 2016-05-11: 100 mL/h via INTRAVENOUS
  Administered 2016-05-12 – 2016-05-14 (×3): via INTRAVENOUS
  Filled 2016-05-10 (×8): qty 1000

## 2016-05-10 MED ORDER — DEXTROSE 5 % IV SOLN
1.5000 g | Freq: Two times a day (BID) | INTRAVENOUS | Status: AC
  Administered 2016-05-10 – 2016-05-11 (×2): 1.5 g via INTRAVENOUS
  Filled 2016-05-10 (×2): qty 1.5

## 2016-05-10 MED ORDER — MAGNESIUM SULFATE 2 GM/50ML IV SOLN
2.0000 g | Freq: Once | INTRAVENOUS | Status: DC | PRN
  Filled 2016-05-10: qty 50

## 2016-05-10 MED ORDER — ACETAMINOPHEN 325 MG PO TABS: 325.0000 mg | ORAL_TABLET | ORAL | Status: DC | PRN

## 2016-05-10 MED ORDER — LIDOCAINE 2% (20 MG/ML) 5 ML SYRINGE
INTRAMUSCULAR | Status: AC
  Filled 2016-05-10: qty 5

## 2016-05-10 MED ORDER — ROCURONIUM BROMIDE 50 MG/5ML IV SOLN
INTRAVENOUS | Status: AC
  Filled 2016-05-10: qty 1

## 2016-05-10 MED ORDER — HYDROMORPHONE HCL 1 MG/ML IJ SOLN
0.2500 mg | INTRAMUSCULAR | Status: DC | PRN
  Administered 2016-05-10 (×2): 0.5 mg via INTRAVENOUS

## 2016-05-10 MED ORDER — DEXTROSE 5 % IV SOLN
1.5000 g | INTRAVENOUS | Status: AC
  Administered 2016-05-10: 1.5 g via INTRAVENOUS

## 2016-05-10 MED ORDER — ONDANSETRON HCL 4 MG/2ML IJ SOLN
INTRAMUSCULAR | Status: AC
  Filled 2016-05-10: qty 2

## 2016-05-10 MED ORDER — FENTANYL CITRATE (PF) 250 MCG/5ML IJ SOLN
INTRAMUSCULAR | Status: DC | PRN
  Administered 2016-05-10: 100 ug via INTRAVENOUS
  Administered 2016-05-10 (×4): 50 ug via INTRAVENOUS
  Administered 2016-05-10: 150 ug via INTRAVENOUS
  Administered 2016-05-10: 100 ug via INTRAVENOUS
  Administered 2016-05-10: 50 ug via INTRAVENOUS
  Administered 2016-05-10: 25 ug via INTRAVENOUS
  Administered 2016-05-10: 75 ug via INTRAVENOUS

## 2016-05-10 MED ORDER — HEPARIN SODIUM (PORCINE) 1000 UNIT/ML IJ SOLN
INTRAMUSCULAR | Status: AC
  Filled 2016-05-10: qty 1

## 2016-05-10 MED ORDER — GUAIFENESIN-DM 100-10 MG/5ML PO SYRP: 15.0000 mL | ORAL_SOLUTION | ORAL | Status: DC | PRN

## 2016-05-10 MED ORDER — HYDROMORPHONE HCL 1 MG/ML IJ SOLN
INTRAMUSCULAR | Status: AC
  Administered 2016-05-10: 0.5 mg via INTRAVENOUS
  Filled 2016-05-10: qty 1

## 2016-05-10 MED ORDER — ONDANSETRON HCL 4 MG/2ML IJ SOLN
INTRAMUSCULAR | Status: DC | PRN
  Administered 2016-05-10 (×2): 4 mg via INTRAVENOUS

## 2016-05-10 MED ORDER — MIDAZOLAM HCL 2 MG/2ML IJ SOLN
INTRAMUSCULAR | Status: AC
  Filled 2016-05-10: qty 4

## 2016-05-10 MED ORDER — ACETAMINOPHEN 325 MG RE SUPP: 325.0000 mg | RECTAL | Status: DC | PRN

## 2016-05-10 MED ORDER — PROPOFOL 10 MG/ML IV BOLUS
INTRAVENOUS | Status: DC | PRN
  Administered 2016-05-10: 120 mg via INTRAVENOUS

## 2016-05-10 MED ORDER — KETAMINE HCL 100 MG/ML IJ SOLN
INTRAMUSCULAR | Status: AC
  Filled 2016-05-10: qty 1

## 2016-05-10 MED ORDER — DEXAMETHASONE SODIUM PHOSPHATE 10 MG/ML IJ SOLN
INTRAMUSCULAR | Status: AC
  Filled 2016-05-10: qty 1

## 2016-05-10 MED ORDER — ALUM & MAG HYDROXIDE-SIMETH 200-200-20 MG/5ML PO SUSP: 15.0000 mL | ORAL | Status: DC | PRN

## 2016-05-10 MED ORDER — NITROGLYCERIN 0.2 MG/ML ON CALL CATH LAB
INTRAVENOUS | Status: DC | PRN
  Administered 2016-05-10 (×2): 160 ug via INTRAVENOUS
  Administered 2016-05-10 (×6): 80 ug via INTRAVENOUS

## 2016-05-10 MED ORDER — SODIUM CHLORIDE 0.9 % IV SOLN: 500.0000 mL | Freq: Once | INTRAVENOUS | Status: DC | PRN

## 2016-05-10 MED ORDER — MANNITOL 25 % IV SOLN
INTRAVENOUS | Status: DC | PRN
  Administered 2016-05-10: 25 g via INTRAVENOUS

## 2016-05-10 MED ORDER — HYDROMORPHONE HCL 1 MG/ML IJ SOLN
INTRAMUSCULAR | Status: AC
  Filled 2016-05-10: qty 1

## 2016-05-10 MED ORDER — METOPROLOL TARTRATE 5 MG/5ML IV SOLN: 2.0000 mg | INTRAVENOUS | Status: DC | PRN

## 2016-05-10 MED ORDER — SUGAMMADEX SODIUM 200 MG/2ML IV SOLN
INTRAVENOUS | Status: DC | PRN
  Administered 2016-05-10: 200 mg via INTRAVENOUS

## 2016-05-10 MED ORDER — PROTAMINE SULFATE 10 MG/ML IV SOLN
INTRAVENOUS | Status: AC
  Filled 2016-05-10: qty 25

## 2016-05-10 MED ORDER — LACTATED RINGERS IV SOLN
INTRAVENOUS | Status: DC | PRN
  Administered 2016-05-10 (×3): via INTRAVENOUS

## 2016-05-10 MED ORDER — LABETALOL HCL 5 MG/ML IV SOLN: 10.0000 mg | INTRAVENOUS | Status: DC | PRN

## 2016-05-10 MED ORDER — PROTAMINE SULFATE 10 MG/ML IV SOLN
INTRAVENOUS | Status: DC | PRN
  Administered 2016-05-10: 10 mg via INTRAVENOUS
  Administered 2016-05-10: 20 mg via INTRAVENOUS
  Administered 2016-05-10 (×2): 10 mg via INTRAVENOUS

## 2016-05-10 MED ORDER — SUCCINYLCHOLINE CHLORIDE 200 MG/10ML IV SOSY
PREFILLED_SYRINGE | INTRAVENOUS | Status: AC
  Filled 2016-05-10: qty 10

## 2016-05-10 MED ORDER — LABETALOL HCL 5 MG/ML IV SOLN
INTRAVENOUS | Status: AC
  Filled 2016-05-10: qty 4

## 2016-05-10 MED ORDER — HYDROMORPHONE HCL 1 MG/ML IJ SOLN
0.2500 mg | INTRAMUSCULAR | Status: DC | PRN
  Administered 2016-05-10 (×4): 0.5 mg via INTRAVENOUS

## 2016-05-10 MED ORDER — ROCURONIUM BROMIDE 100 MG/10ML IV SOLN
INTRAVENOUS | Status: DC | PRN
  Administered 2016-05-10: 50 mg via INTRAVENOUS
  Administered 2016-05-10: 30 mg via INTRAVENOUS

## 2016-05-10 MED ORDER — LACTATED RINGERS IV SOLN
INTRAVENOUS | Status: DC | PRN
  Administered 2016-05-10 (×3): via INTRAVENOUS

## 2016-05-10 MED ORDER — SODIUM CHLORIDE 0.9 % IV SOLN
INTRAVENOUS | Status: DC | PRN
  Administered 2016-05-10: 10 ug/kg/min via INTRAVENOUS

## 2016-05-10 MED ORDER — SUGAMMADEX SODIUM 200 MG/2ML IV SOLN
INTRAVENOUS | Status: AC
  Filled 2016-05-10: qty 2

## 2016-05-10 MED ORDER — HEPARIN SODIUM (PORCINE) 1000 UNIT/ML IJ SOLN
INTRAMUSCULAR | Status: DC | PRN
  Administered 2016-05-10: 7000 [IU] via INTRAVENOUS

## 2016-05-10 MED ORDER — SODIUM CHLORIDE 0.9 % IV SOLN: Freq: Once | INTRAVENOUS | Status: DC

## 2016-05-10 MED ORDER — EPHEDRINE SULFATE 50 MG/ML IJ SOLN
INTRAMUSCULAR | Status: DC | PRN
  Administered 2016-05-10: 5 mg via INTRAVENOUS

## 2016-05-10 MED ORDER — PHENYLEPHRINE HCL 10 MG/ML IJ SOLN
INTRAMUSCULAR | Status: DC | PRN
  Administered 2016-05-10: 80 ug via INTRAVENOUS
  Administered 2016-05-10: 120 ug via INTRAVENOUS
  Administered 2016-05-10: 160 ug via INTRAVENOUS
  Administered 2016-05-10 (×2): 120 ug via INTRAVENOUS

## 2016-05-10 MED ORDER — KETAMINE HCL 50 MG/ML IJ SOLN
INTRAMUSCULAR | Status: DC | PRN
  Administered 2016-05-10: 35.7 mg via INTRAMUSCULAR
  Administered 2016-05-10: 20 mg via INTRAMUSCULAR
  Administered 2016-05-10 (×3): 35.7 mg via INTRAMUSCULAR

## 2016-05-10 MED ORDER — EPHEDRINE 5 MG/ML INJ
INTRAVENOUS | Status: AC
  Filled 2016-05-10: qty 10

## 2016-05-10 MED ORDER — MORPHINE SULFATE (PF) 2 MG/ML IV SOLN
2.0000 mg | INTRAVENOUS | Status: DC | PRN
  Administered 2016-05-10 (×2): 2 mg via INTRAVENOUS
  Administered 2016-05-10: 4 mg via INTRAVENOUS
  Administered 2016-05-10 (×2): 2 mg via INTRAVENOUS
  Administered 2016-05-10 – 2016-05-11 (×2): 4 mg via INTRAVENOUS
  Administered 2016-05-11: 5 mg via INTRAVENOUS
  Administered 2016-05-11 (×3): 4 mg via INTRAVENOUS
  Administered 2016-05-11 (×2): 5 mg via INTRAVENOUS
  Administered 2016-05-11 (×2): 4 mg via INTRAVENOUS
  Administered 2016-05-11: 5 mg via INTRAVENOUS
  Administered 2016-05-12: 4 mg via INTRAVENOUS
  Administered 2016-05-12: 5 mg via INTRAVENOUS
  Administered 2016-05-12 (×2): 2 mg via INTRAVENOUS
  Administered 2016-05-12: 4 mg via INTRAVENOUS
  Administered 2016-05-12: 5 mg via INTRAVENOUS
  Administered 2016-05-13: 4 mg via INTRAVENOUS
  Filled 2016-05-10 (×3): qty 2
  Filled 2016-05-10: qty 1
  Filled 2016-05-10: qty 3
  Filled 2016-05-10: qty 1
  Filled 2016-05-10 (×3): qty 2
  Filled 2016-05-10: qty 3
  Filled 2016-05-10 (×2): qty 2
  Filled 2016-05-10 (×2): qty 1
  Filled 2016-05-10: qty 3
  Filled 2016-05-10: qty 1
  Filled 2016-05-10: qty 2
  Filled 2016-05-10: qty 1
  Filled 2016-05-10: qty 2
  Filled 2016-05-10: qty 3
  Filled 2016-05-10: qty 2
  Filled 2016-05-10 (×2): qty 3

## 2016-05-10 MED ORDER — ALBUMIN HUMAN 5 % IV SOLN
INTRAVENOUS | Status: DC | PRN
  Administered 2016-05-10: 10:00:00 via INTRAVENOUS

## 2016-05-10 MED ORDER — LIDOCAINE 2% (20 MG/ML) 5 ML SYRINGE
INTRAMUSCULAR | Status: DC | PRN
  Administered 2016-05-10: 40 mg via INTRAVENOUS

## 2016-05-10 MED ORDER — 0.9 % SODIUM CHLORIDE (POUR BTL) OPTIME
TOPICAL | Status: DC | PRN
  Administered 2016-05-10: 2000 mL

## 2016-05-10 MED ORDER — MIDAZOLAM HCL 5 MG/5ML IJ SOLN
INTRAMUSCULAR | Status: DC | PRN
  Administered 2016-05-10 (×4): 1 mg via INTRAVENOUS

## 2016-05-10 MED ORDER — NITROGLYCERIN IN D5W 200-5 MCG/ML-% IV SOLN
INTRAVENOUS | Status: DC | PRN
  Administered 2016-05-10: 5 ug/min via INTRAVENOUS

## 2016-05-10 MED ORDER — PHENOL 1.4 % MT LIQD
1.0000 | OROMUCOSAL | Status: DC | PRN
Start: 2016-05-10 — End: 2016-05-14

## 2016-05-10 MED ORDER — ONDANSETRON HCL 4 MG/2ML IJ SOLN: 4.0000 mg | Freq: Four times a day (QID) | INTRAMUSCULAR | Status: DC | PRN

## 2016-05-10 MED ORDER — PHENYLEPHRINE HCL 10 MG/ML IJ SOLN
INTRAVENOUS | Status: DC | PRN
  Administered 2016-05-10: 20 ug/min via INTRAVENOUS

## 2016-05-10 MED ORDER — MORPHINE SULFATE (PF) 2 MG/ML IV SOLN
INTRAVENOUS | Status: AC
  Filled 2016-05-10: qty 1

## 2016-05-10 MED FILL — Sodium Chloride IV Soln 0.9%: INTRAVENOUS | Qty: 1000 | Status: AC

## 2016-05-10 MED FILL — Heparin Sodium (Porcine) Inj 1000 Unit/ML: INTRAMUSCULAR | Qty: 30 | Status: AC

## 2016-05-10 SURGICAL SUPPLY — 61 items
BENZOIN TINCTURE PRP APPL 2/3 (GAUZE/BANDAGES/DRESSINGS) ×9 IMPLANT
CANISTER SUCTION 2500CC (MISCELLANEOUS) ×3 IMPLANT
CANNULA VESSEL 3MM 2 BLNT TIP (CANNULA) ×6 IMPLANT
CLIP LIGATING EXTRA MED SLVR (CLIP) ×3 IMPLANT
CLIP LIGATING EXTRA SM BLUE (MISCELLANEOUS) ×3 IMPLANT
CLOSURE STERI-STRIP 1/2X4 (GAUZE/BANDAGES/DRESSINGS) ×3
CLOSURE WOUND 1/2 X4 (GAUZE/BANDAGES/DRESSINGS) ×2
CLSR STERI-STRIP ANTIMIC 1/2X4 (GAUZE/BANDAGES/DRESSINGS) ×6 IMPLANT
COVER TABLE BACK 60X90 (DRAPES) ×3 IMPLANT
DRAPE BILATERAL SPLIT (DRAPES) IMPLANT
DRAPE CV SPLIT W-CLR ANES SCRN (DRAPES) IMPLANT
DRSG COVADERM 4X14 (GAUZE/BANDAGES/DRESSINGS) ×3 IMPLANT
DRSG COVADERM 4X6 (GAUZE/BANDAGES/DRESSINGS) ×6 IMPLANT
ELECT BLADE 4.0 EZ CLEAN MEGAD (MISCELLANEOUS) ×3
ELECT CAUTERY BLADE 6.4 (BLADE) ×3 IMPLANT
ELECT REM PT RETURN 9FT ADLT (ELECTROSURGICAL) ×3
ELECTRODE BLDE 4.0 EZ CLN MEGD (MISCELLANEOUS) ×1 IMPLANT
ELECTRODE REM PT RTRN 9FT ADLT (ELECTROSURGICAL) ×1 IMPLANT
GLOVE BIO SURGEON STRL SZ 6.5 (GLOVE) ×4 IMPLANT
GLOVE BIO SURGEON STRL SZ7.5 (GLOVE) ×3 IMPLANT
GLOVE BIO SURGEONS STRL SZ 6.5 (GLOVE) ×2
GLOVE BIOGEL PI IND STRL 6.5 (GLOVE) ×5 IMPLANT
GLOVE BIOGEL PI INDICATOR 6.5 (GLOVE) ×10
GLOVE ECLIPSE 6.5 STRL STRAW (GLOVE) ×9 IMPLANT
GLOVE SS BIOGEL STRL SZ 7.5 (GLOVE) ×1 IMPLANT
GLOVE SUPERSENSE BIOGEL SZ 7.5 (GLOVE) ×2
GOWN STRL REUS W/ TWL LRG LVL3 (GOWN DISPOSABLE) ×3 IMPLANT
GOWN STRL REUS W/TWL LRG LVL3 (GOWN DISPOSABLE) ×6
GRAFT HEMASHIELD 14X8MM (Vascular Products) ×3 IMPLANT
INSERT FOGARTY 61MM (MISCELLANEOUS) ×3 IMPLANT
INSERT FOGARTY SM (MISCELLANEOUS) ×6 IMPLANT
KIT BASIN OR (CUSTOM PROCEDURE TRAY) ×3 IMPLANT
KIT ROOM TURNOVER OR (KITS) ×3 IMPLANT
NS IRRIG 1000ML POUR BTL (IV SOLUTION) ×6 IMPLANT
PACK AORTA (CUSTOM PROCEDURE TRAY) ×3 IMPLANT
PAD ARMBOARD 7.5X6 YLW CONV (MISCELLANEOUS) ×6 IMPLANT
PENCIL BUTTON HOLSTER BLD 10FT (ELECTRODE) ×3 IMPLANT
SPONGE LAP 18X18 X RAY DECT (DISPOSABLE) IMPLANT
STAPLER VISISTAT 35W (STAPLE) IMPLANT
STRIP CLOSURE SKIN 1/2X4 (GAUZE/BANDAGES/DRESSINGS) ×4 IMPLANT
SUT ETHIBOND 5 LR DA (SUTURE) IMPLANT
SUT PDS AB 1 TP1 54 (SUTURE) ×6 IMPLANT
SUT PROLENE 3 0 SH1 36 (SUTURE) ×9 IMPLANT
SUT PROLENE 5 0 C 1 24 (SUTURE) ×6 IMPLANT
SUT PROLENE 5 0 C 1 36 (SUTURE) IMPLANT
SUT PROLENE 6 0 CC (SUTURE) ×3 IMPLANT
SUT SILK 2 0 (SUTURE) ×2
SUT SILK 2 0 SH CR/8 (SUTURE) ×3 IMPLANT
SUT SILK 2 0 TIES 17X18 (SUTURE) ×2
SUT SILK 2-0 18XBRD TIE 12 (SUTURE) ×1 IMPLANT
SUT SILK 2-0 18XBRD TIE BLK (SUTURE) ×1 IMPLANT
SUT SILK 3 0 (SUTURE) ×2
SUT SILK 3 0 TIES 17X18 (SUTURE) ×3
SUT SILK 3-0 18XBRD TIE 12 (SUTURE) ×1 IMPLANT
SUT SILK 3-0 18XBRD TIE BLK (SUTURE) ×1 IMPLANT
SUT VIC AB 2-0 CT1 36 (SUTURE) ×9 IMPLANT
SUT VIC AB 3-0 SH 27 (SUTURE) ×8
SUT VIC AB 3-0 SH 27X BRD (SUTURE) ×4 IMPLANT
TOWEL BLUE STERILE X RAY DET (MISCELLANEOUS) ×6 IMPLANT
TRAY FOLEY W/METER SILVER 16FR (SET/KITS/TRAYS/PACK) ×3 IMPLANT
WATER STERILE IRR 1000ML POUR (IV SOLUTION) ×6 IMPLANT

## 2016-05-10 NOTE — Progress Notes (Signed)
Patient arrived to PACU on nitro drip at 133mcg/min. Dr Early at bedside and per his order will titrate off of Nitro before moving to the floor. Will manage BP with medications as ordered for floor.

## 2016-05-10 NOTE — Anesthesia Postprocedure Evaluation (Signed)
Anesthesia Post Note  Patient: Gary [name]  Procedure(s) Performed: Procedure(s) (LRB): AORTOBIFEMORAL BYPASS GRAFT (Bilateral)  Patient location during evaluation: PACU Anesthesia Type: General Level of consciousness: awake and alert Pain management: pain level controlled Vital Signs Assessment: post-procedure vital signs reviewed and stable Respiratory status: spontaneous breathing, nonlabored ventilation, respiratory function stable and patient connected to nasal cannula oxygen Cardiovascular status: blood pressure returned to baseline and stable Postop Assessment: no signs of nausea or vomiting Anesthetic complications: no    Last Vitals:  Vitals:   05/10/16 1345 05/10/16 1400  BP: 134/70 139/69  Pulse: 85 78  Resp: (!) 9 15  Temp:      Last Pain:  Vitals:   05/10/16 1414  TempSrc:   PainSc: Asleep                 Bettina Warn,W. EDMOND

## 2016-05-10 NOTE — Anesthesia Procedure Notes (Addendum)
Central Venous Catheter Insertion Performed by: anesthesiologist 05/10/2016 7:22 AM Patient location: Pre-op. Preanesthetic checklist: patient identified, IV checked, site marked, risks and benefits discussed, surgical consent, monitors and equipment checked, pre-op evaluation, timeout performed and anesthesia consent Position: Trendelenburg Lidocaine 1% used for infiltration Landmarks identified and Seldinger technique used Catheter size: 8.5 Fr Central line was placed.Sheath introducer Swan type and PA catheter depth:50PA Cath depth:50 Procedure performed using ultrasound guided technique. Attempts: 1 Following insertion, line sutured and dressing applied. Post procedure assessment: blood return through all ports, free fluid flow and no air. Patient tolerated the procedure well with no immediate complications.

## 2016-05-10 NOTE — Progress Notes (Addendum)
Patient came from PACU without NG TUBE, per PACU RN, patient does not need NG per MD.  Patient is NPO and is not experiencing nausea/vomiting

## 2016-05-10 NOTE — Anesthesia Procedure Notes (Signed)
Procedure Name: Intubation Date/Time: 05/10/2016 7:54 AM Performed by: Mervyn Gay Pre-anesthesia Checklist: Patient identified, Patient being monitored, Timeout performed, Emergency Drugs available and Suction available Patient Re-evaluated:Patient Re-evaluated prior to inductionOxygen Delivery Method: Circle System Utilized Preoxygenation: Pre-oxygenation with 100% oxygen Intubation Type: IV induction Ventilation: Mask ventilation without difficulty and Oral airway inserted - appropriate to patient size Laryngoscope Size: Miller and 3 Grade View: Grade I Tube type: Oral Tube size: 8.0 mm Number of attempts: 1 Airway Equipment and Method: Stylet Placement Confirmation: ETT inserted through vocal cords under direct vision,  positive ETCO2 and breath sounds checked- equal and bilateral Secured at: 21 cm Tube secured with: Tape Dental Injury: Teeth and Oropharynx as per pre-operative assessment

## 2016-05-10 NOTE — Interval H&P Note (Signed)
History and Physical Interval Note:  05/10/2016 6:50 AM  Gary [name]  has presented today for surgery, with the diagnosis of Peripheral vascular disease with bilateral lower extremity claudication I70.213  The various methods of treatment have been discussed with the patient and family. After consideration of risks, benefits and other options for treatment, the patient has consented to  Procedure(s): AORTOBIFEMORAL BYPASS GRAFT (Bilateral) as a surgical intervention .  The patient's history has been reviewed, patient examined, no change in status, stable for surgery.  I have reviewed the patient's chart and labs.  Questions were answered to the patient's satisfaction.     Curt Jews

## 2016-05-10 NOTE — Transfer of Care (Signed)
Immediate Anesthesia Transfer of Care Note  Patient: Gary [name]  Procedure(s) Performed: Procedure(s): AORTOBIFEMORAL BYPASS GRAFT (Bilateral)  Patient Location: PACU  Anesthesia Type:General  Level of Consciousness: sedated and patient cooperative  Airway & Oxygen Therapy: Patient Spontanous Breathing and Patient connected to nasal cannula oxygen  Post-op Assessment: Report given to RN and Post -op Vital signs reviewed and stable  Post vital signs: Reviewed and stable  Last Vitals:  Vitals:   05/10/16 0611  BP: (!) 148/60  Pulse: (!) 56  Resp: 16  Temp: 37.1 C    Last Pain:  Vitals:   05/10/16 0611  TempSrc: Oral  PainSc: 7       Patients Stated Pain Goal: 2 ([id number])  Complications: No apparent anesthesia complications
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
## 2016-05-10 NOTE — Progress Notes (Signed)
Received phone call from xray that ng tube was looped around stomach and needed to be removed/replaced. Notified Dr Donnetta Hutching and per his order will discontinue NG tube.

## 2016-05-10 NOTE — Progress Notes (Signed)
Nitro drip weaned and turned off at 1250. Patient tolerating well. Current BP 129/64 cuff and 143/61 Left arterial line.

## 2016-05-11 ENCOUNTER — Inpatient Hospital Stay (HOSPITAL_COMMUNITY): Payer: Medicare Other

## 2016-05-11 LAB — CBC
HEMATOCRIT: 40.5 % (ref 39.0–52.0)
Hemoglobin: 12.9 g/dL — ABNORMAL LOW (ref 13.0–17.0)
MCH: 29.7 pg (ref 26.0–34.0)
MCHC: 31.9 g/dL (ref 30.0–36.0)
MCV: 93.1 fL (ref 78.0–100.0)
PLATELETS: 164 10*3/uL (ref 150–400)
RBC: 4.35 MIL/uL (ref 4.22–5.81)
RDW: 14.2 % (ref 11.5–15.5)
WBC: 12.8 10*3/uL — ABNORMAL HIGH (ref 4.0–10.5)

## 2016-05-11 LAB — COMPREHENSIVE METABOLIC PANEL
ALBUMIN: 3.2 g/dL — AB (ref 3.5–5.0)
ALT: 13 U/L — ABNORMAL LOW (ref 17–63)
ANION GAP: 6 (ref 5–15)
AST: 20 U/L (ref 15–41)
Alkaline Phosphatase: 29 U/L — ABNORMAL LOW (ref 38–126)
BILIRUBIN TOTAL: 0.6 mg/dL (ref 0.3–1.2)
BUN: 9 mg/dL (ref 6–20)
CHLORIDE: 102 mmol/L (ref 101–111)
CO2: 26 mmol/L (ref 22–32)
Calcium: 8.4 mg/dL — ABNORMAL LOW (ref 8.9–10.3)
Creatinine, Ser: 0.78 mg/dL (ref 0.61–1.24)
GFR calc Af Amer: >60 mL/min (ref >60)
Glucose, Bld: 161 mg/dL — ABNORMAL HIGH (ref 65–99)
POTASSIUM: 4.3 mmol/L (ref 3.5–5.1)
Sodium: 134 mmol/L — ABNORMAL LOW (ref 135–145)
TOTAL PROTEIN: 5.9 g/dL — AB (ref 6.5–8.1)

## 2016-05-11 LAB — AMYLASE: Amylase: 60 U/L (ref 28–100)

## 2016-05-11 LAB — MAGNESIUM: MAGNESIUM: 1.6 mg/dL — AB (ref 1.7–2.4)

## 2016-05-11 NOTE — Progress Notes (Signed)
Pt arrived to unit from 2S.  Telemetry applied and CCMD notified.  Pt oriented to room including call light and telephone.  Pt care plan discussed with Pt.  All questions answered.  Will cont to monitor.

## 2016-05-11 NOTE — Progress Notes (Signed)
   VASCULAR SURGERY ASSESSMENT & PLAN:  1 Day Post-Op s/p: AFBG  Doing well  Keep NPO until bowel function returns  Renal function normal  Tx 2W  Mobilize  DVT prophylaxis: Lovenox  SUBJECTIVE: Pain well controlled  PHYSICAL EXAM: Vitals:   05/11/16 0400 05/11/16 0500 05/11/16 0600 05/11/16 0700  BP: (!) 145/63 138/67 (!) 149/71   Pulse: 80 81 78 68  Resp: 17 14 14 20   Temp: 98.7 F (37.1 C)     TempSrc:      SpO2: 97% 98% 97% 96%  Weight:       Dressings with minimal drainage Lungs clear Abd: quiet Palp pedal pulses  LABS: Lab Results  Component Value Date   WBC 12.8 (H) 05/11/2016   HGB 12.9 (L) 05/11/2016   HCT 40.5 05/11/2016   MCV 93.1 05/11/2016   PLT 164 05/11/2016   Lab Results  Component Value Date   CREATININE 0.78 05/11/2016   Lab Results  Component Value Date   INR 1.15 05/10/2016   Active Problems:   Aortoiliac occlusive disease [facility])   Gae Gallop [id number] 05/11/2016

## 2016-05-11 NOTE — Evaluation (Signed)
Physical Therapy Evaluation Patient Details Name: Gary [name] MRN: [id number] DOB: 02-08-1956 Today's Date: 05/11/2016   History of Present Illness  Patient is a 60 yo male admitted 05/10/16 with severe aortoiliac occlusive disease.  Patient with intolerable claudication, unable to walk any distance.   PMH:  claudication, depression, abdominal injury 2012  Clinical Impression  Patient presents with problems listed below.  Will benefit from acute PT to maximize functional independence prior to discharge to daughter's home.  Anticipate patient will progress well with PT once pain decreases.  Do not anticipate any f/u PT needs at this time.  Will continue to assess need.    Follow Up Recommendations No PT follow up;Supervision for mobility/OOB    Equipment Recommendations  Rolling walker with 5" wheels (Will continue to assess need)    Recommendations for Other Services       Precautions / Restrictions Precautions Precautions: Fall Restrictions Weight Bearing Restrictions: No      Mobility  Bed Mobility Overal bed mobility: Needs Assistance Bed Mobility: Rolling;Sidelying to Sit Rolling: Min assist Sidelying to sit: Mod assist       General bed mobility comments: Verbal cues to logroll and move to sitting.  Assist to bring LE's and hips forward during rolling.  Assist to raise trunk to sitting position.  Required increased time due to increase in pain.  Transfers Overall transfer level: Needs assistance Equipment used: 2 person hand held assist Transfers: Sit to/from [facility] Sit to Stand: Min assist;+2 safety/equipment Stand pivot transfers: Min assist;+2 safety/equipment       General transfer comment: Verbal cues for technique.  Assist to rise to standing.  Patient holding pillow to abdomen for pain control.  Patient able to take several shuffle steps to pivot to chair.    Ambulation/Gait             General Gait Details: Patient  declined  Stairs            Wheelchair Mobility    Modified Rankin (Stroke Patients Only)       Balance Overall balance assessment: Needs assistance         Standing balance support: Single extremity supported Standing balance-Leahy Scale: Poor Standing balance comment: UE support and assist for balance, primarily due to pain.                             Pertinent Vitals/Pain Pain Assessment: 0-10 Pain Score: 10-Worst pain ever Pain Location: Abdomen/groing - incision sites Pain Descriptors / Indicators: Grimacing;Guarding;Operative site guarding;Sharp;Stabbing;Tender Pain Intervention(s): Limited activity within patient's tolerance;Monitored during session;Repositioned;Patient requesting pain meds-RN notified    Home Living Family/patient expects to be discharged to:: Private residence Living Arrangements: Children (Patient will be going to daughter's home at d/c) Available Help at Discharge: Family;Available 24 hours/day (Daughter) Type of Home: Mobile home Home Access: Stairs to enter Entrance Stairs-Rails: Psychiatric nurse of Steps: 2 Home Layout: One level Home Equipment: None      Prior Function Level of Independence: Independent               Hand Dominance        Extremity/Trunk Assessment   Upper Extremity Assessment: Overall WFL for tasks assessed           Lower Extremity Assessment: Generalized weakness (Due to pain)         Communication   Communication: No difficulties  Cognition Arousal/Alertness: Awake/alert Behavior During Therapy:  WFL for tasks assessed/performed Overall Cognitive Status: Within Functional Limits for tasks assessed                      General Comments      Exercises        Assessment/Plan    PT Assessment Patient needs continued PT services  PT Diagnosis Difficulty walking;Generalized weakness;Acute pain   PT Problem List Decreased strength;Decreased  activity tolerance;Decreased balance;Decreased mobility;Decreased knowledge of use of DME;Pain  PT Treatment Interventions DME instruction;Gait training;Stair training;Functional mobility training;Therapeutic activities;Therapeutic exercise;Patient/family education   PT Goals (Current goals can be found in the Care Plan section) Acute Rehab PT Goals Patient Stated Goal: To decrease pain PT Goal Formulation: With patient Time For Goal Achievement: 05/18/16 Potential to Achieve Goals: Good    Frequency Min 3X/week   Barriers to discharge   Patient going to daughter's home at d/c for 24 hour assist.    Co-evaluation               End of Session Equipment Utilized During Treatment: Oxygen Activity Tolerance: Patient limited by pain Patient left: in chair;with call bell/phone within reach Nurse Communication: Mobility status;Patient requests pain meds         Time: [id number] PT Time Calculation (min) (ACUTE ONLY): 20 min   Charges:   PT Evaluation $PT Eval Moderate Complexity: 1 Procedure     PT G Codes:        Despina Pole 06-09-2016, 4:55 PM Carita Pian. Sanjuana Kava, [place] Pager [phone]

## 2016-05-12 ENCOUNTER — Encounter (HOSPITAL_COMMUNITY): Payer: Self-pay | Admitting: *Deleted

## 2016-05-12 MED ORDER — BISACODYL 10 MG RE SUPP
10.0000 mg | Freq: Once | RECTAL | Status: AC
Start: 2016-05-12 — End: 2016-05-12
  Administered 2016-05-12: 10 mg via RECTAL
  Filled 2016-05-12: qty 1

## 2016-05-12 MED ORDER — KETOROLAC TROMETHAMINE 30 MG/ML IJ SOLN
30.0000 mg | Freq: Four times a day (QID) | INTRAMUSCULAR | Status: DC
  Administered 2016-05-12 – 2016-05-14 (×8): 30 mg via INTRAVENOUS
  Filled 2016-05-12 (×8): qty 1

## 2016-05-12 MED ORDER — NICOTINE 14 MG/24HR TD PT24
14.0000 mg | MEDICATED_PATCH | Freq: Every day | TRANSDERMAL | Status: DC
  Administered 2016-05-12 – 2016-05-13 (×2): 14 mg via TRANSDERMAL
  Filled 2016-05-12 (×2): qty 1

## 2016-05-12 NOTE — Progress Notes (Signed)
Physical Therapy Treatment Patient Details Name: Gary [name] MRN: [id number] DOB: [birth year] Today's Date: 05/12/2016    History of Present Illness Patient is a 60 yo male admitted 05/10/16 with severe aortoiliac occlusive disease.  Patient with intolerable claudication, unable to walk any distance.   PMH:  claudication, depression, abdominal injury 2012    PT Comments    Patient has greatly improved with mobility and gait with improved pain control.  Patient able to ambulate 200' with no assistive device and good balance.  No loss of balance with high level balance activities of head turns, direction changes.  Patient reports he feels better about going to daughter's home at discharge.  Will perform stair training tomorrow.   Follow Up Recommendations  No PT follow up;Supervision for mobility/OOB     Equipment Recommendations  None recommended by PT    Recommendations for Other Services       Precautions / Restrictions Precautions Precautions: None Restrictions Weight Bearing Restrictions: No    Mobility  Bed Mobility Overal bed mobility: Modified Independent Bed Mobility: Supine to Sit;Sit to Supine     Supine to sit: Modified independent (Device/Increase time) Sit to supine: Modified independent (Device/Increase time)   General bed mobility comments: Increased time.  No assist needed.  Transfers Overall transfer level: Modified independent Equipment used: None Transfers: Sit to/from Stand Sit to Stand: Modified independent (Device/Increase time)         General transfer comment: No assist to move to standing.  Good balance in stance.  Ambulation/Gait Ambulation/Gait assistance: Supervision Ambulation Distance (Feet): 200 Feet (100' pushing IV pole, 100' with no support) Assistive device: None (IV pole) Gait Pattern/deviations: Step-through pattern;Decreased stride length Gait velocity: slightly decreased Gait velocity interpretation: Below normal speed for  age/gender General Gait Details: Patient with good gait pattern and balance.  Slightly slower speed.  No loss of balance with head turns during gait with no UE support.   Stairs            Wheelchair Mobility    Modified Rankin (Stroke Patients Only)       Balance           Standing balance support: No upper extremity supported Standing balance-Leahy Scale: Good                      Cognition Arousal/Alertness: Awake/alert Behavior During Therapy: WFL for tasks assessed/performed Overall Cognitive Status: Within Functional Limits for tasks assessed                      Exercises      General Comments        Pertinent Vitals/Pain Pain Assessment: 0-10 Pain Score: 5  Pain Location: abdomen/incision sites Pain Descriptors / Indicators: Sore;Tender Pain Intervention(s): Monitored during session;Premedicated before session;Repositioned    Home Living                      Prior Function            PT Goals (current goals can now be found in the care plan section) Progress towards PT goals: Progressing toward goals    Frequency  Min 3X/week    PT Plan Current plan remains appropriate;Equipment recommendations need to be updated    Co-evaluation             End of Session   Activity Tolerance: Patient tolerated treatment well Patient left: in bed;with call bell/phone within reach;with family/visitor  present     Time: [id number] PT Time Calculation (min) (ACUTE ONLY): 10 min  Charges:  $Gait Training: 8-22 mins                    G Codes:      Despina Pole 05-15-16, 5:26 PM Carita Pian. Sanjuana Kava, [place] Pager [phone]

## 2016-05-12 NOTE — Progress Notes (Signed)
Family member of patient, daughter, Julianne Rice, would like to be contacted about rehab facilities when discharge planning takes place. Her number is [phone].  Cyndia Bent

## 2016-05-12 NOTE — Progress Notes (Signed)
Occupational Therapy Evaluation Patient Details Name: Gary [name] MRN: [id number] DOB: 1956/04/03 Today's Date: 05/12/2016    History of Present Illness Patient is a 60 yo male admitted 05/10/16 with severe aortoiliac occlusive disease.  Patient with intolerable claudication, unable to walk any distance.   PMH:  claudication, depression, abdominal injury 2012   Clinical Impression   PTA, pt was independent with ADKS and mobility. Pt currently requires min assist for all basic transfers and mod assist for seated LB ADLs due to acute abdominal and bilateral groin region pain. Pt is very concerned that his daughter will not be able to provide the necessary level of care for him at her house due to his increased level of pain - at this time pt would prefer to go to SNF for post-acute rehab. Anticipate pt will progress well with proper better pain management regimen. Pt will benefit from continued acute OT to increase independence and safety with ADLs and mobility to allow for safe discharge to the venue listed below.     Follow Up Recommendations  SNF;Supervision/Assistance - 24 hour - HHOT if pt progresses with better pain management regimen    Equipment Recommendations  Other (comment) (RW-2 wheeled)    Recommendations for Other Services       Precautions / Restrictions Precautions Precautions: Fall Restrictions Weight Bearing Restrictions: No      Mobility Bed Mobility Overal bed mobility: Needs Assistance Bed Mobility: Rolling;Sidelying to Sit Rolling: Min guard Sidelying to sit: Min assist       General bed mobility comments: Assist for trunk support to come to sitting position. VCs for hand placement.  Transfers Overall transfer level: Needs assistance Equipment used: Rolling walker (2 wheeled) Transfers: Sit to/from [facility] Sit to Stand: Min assist Stand pivot transfers: Min assist       General transfer comment: Min assist for boost to stand  and to stabilize balance during pivotal steps to chair. VCs for hand placement and safety.    Balance Overall balance assessment: Needs assistance Sitting-balance support: No upper extremity supported;Feet supported Sitting balance-Leahy Scale: Fair Sitting balance - Comments: limited due to pain   Standing balance support: Bilateral upper extremity supported;During functional activity Standing balance-Leahy Scale: Poor                              ADL Overall ADL's : Needs assistance/impaired     Grooming: Wash/dry hands;Wash/dry face;Set up;Sitting   Upper Body Bathing: Set up;Sitting   Lower Body Bathing: Moderate assistance;Sit to/from stand   Upper Body Dressing : Set up;Sitting   Lower Body Dressing: Moderate assistance;Sit to/from stand   Toilet Transfer: Minimal assistance;Cueing for safety;Stand-pivot;RW Toilet Transfer Details (indicate cue type and reason): simulated from bed to chair Toileting- Clothing Manipulation and Hygiene: Moderate assistance;Sit to/from stand       Functional mobility during ADLs: Minimal assistance;Rolling walker       Vision Vision Assessment?: No apparent visual deficits   Perception     Praxis      Pertinent Vitals/Pain Pain Assessment: 0-10 Pain Score: 10-Worst pain ever Pain Location: abdomen/incision sites Pain Descriptors / Indicators: Grimacing;Guarding;Moaning;Operative site guarding Pain Intervention(s): Limited activity within patient's tolerance;Monitored during session;Repositioned;Patient requesting pain meds-RN notified;RN gave pain meds during session     Hand Dominance Right   Extremity/Trunk Assessment Upper Extremity Assessment Upper Extremity Assessment: Overall WFL for tasks assessed   Lower Extremity Assessment Lower Extremity Assessment: Generalized weakness  Cervical / Trunk Assessment Cervical / Trunk Assessment: Normal   Communication Communication Communication: No  difficulties   Cognition Arousal/Alertness: Awake/alert Behavior During Therapy: WFL for tasks assessed/performed Overall Cognitive Status: Within Functional Limits for tasks assessed                     General Comments       Exercises       Shoulder Instructions      Home Living Family/patient expects to be discharged to:: Unsure Living Arrangements: Children Available Help at Discharge: Family;Available 24 hours/day (daughter) Type of Home: Mobile home Home Access: Stairs to enter Entrance Stairs-Number of Steps: 2 Entrance Stairs-Rails: Right;Left Home Layout: One level               Home Equipment: None   Additional Comments: Pt initially thought he could go to his daughter's house, but now he is very concerned about his level of pain and does not think his daughter will be able to help him. Pt would prefer to go to a rehab facility at this time      Prior Functioning/Environment Level of Independence: Independent             OT Diagnosis: Generalized weakness;Acute pain   OT Problem List: Decreased strength;Decreased range of motion;Decreased activity tolerance;Impaired balance (sitting and/or standing);Decreased safety awareness;Decreased knowledge of use of DME or AE;Decreased knowledge of precautions;Pain   OT Treatment/Interventions: Self-care/ADL training;Therapeutic exercise;DME and/or AE instruction;Therapeutic activities;Patient/family education;Balance training;Energy conservation    OT Goals(Current goals can be found in the care plan section) Acute Rehab OT Goals Patient Stated Goal: to have less pain OT Goal Formulation: With patient Time For Goal Achievement: 05/26/16 Potential to Achieve Goals: Good ADL Goals Pt Will Perform Grooming: with modified independence;standing Pt Will Perform Lower Body Bathing: with modified independence;with adaptive equipment;sit to/from stand Pt Will Perform Lower Body Dressing: with modified  independence;with adaptive equipment;sit to/from stand Pt Will Transfer to Toilet: with modified independence;ambulating;bedside commode (over toilet) Pt Will Perform Toileting - Clothing Manipulation and hygiene: with modified independence;sit to/from stand  OT Frequency: Min 2X/week   Barriers to D/C: Decreased caregiver support  Pt unsure if his daughter can provide necessary level of care. He has not spoken with her since his procedure.       Co-evaluation              End of Session Equipment Utilized During Treatment: Gait belt;Rolling walker;Oxygen Nurse Communication: Mobility status  Activity Tolerance: Patient limited by pain Patient left: in chair;with call bell/phone within reach   Time: [id number] OT Time Calculation (min): 24 min Charges:  OT General Charges $OT Visit: 1 Procedure OT Evaluation $OT Eval Moderate Complexity: 1 Procedure OT Treatments $Self Care/Home Management : 8-22 mins G-Codes:    Redmond Baseman, OTR/L Pager: [phone] 05/12/2016, 9:47 AM

## 2016-05-12 NOTE — Progress Notes (Addendum)
[facility] Progress Note 10:27 AM 2 Days Post-Op  Subjective:  Says he has not had any nausea, but has a lot of phlegm.  Pain is not well controlled.  Denies any flatus.  Says he can feel a difference in his feet and they definitely feel better.  Tm 99 now afebrile HR  70's-80's NSR [id number] systolic [id number] RA  Vitals:   05/11/16 2030 05/12/16 0440  BP: 128/62 (!) 145/47  Pulse: 83 85  Resp: 18 20  Temp: 99 F (37.2 C) 98.2 F (36.8 C)    Physical Exam: Cardiac:  regular Lungs:   Decreased BS at bases Abdomen:  Soft, NT/ND; +BS; -flatus; -BM Incisions:  Laparotomy incision and bilateral groin incisions are clean and dry with steri strips in tact.   Extremities:  Easily palpable bilateral DP and right PT pulses. Bilateral feet are warm.  CBC    Component Value Date/Time   WBC 12.8 (H) 05/11/2016 0420   RBC 4.35 05/11/2016 0420   HGB 12.9 (L) 05/11/2016 0420   HCT 40.5 05/11/2016 0420   PLT 164 05/11/2016 0420   MCV 93.1 05/11/2016 0420   MCH 29.7 05/11/2016 0420   MCHC 31.9 05/11/2016 0420   RDW 14.2 05/11/2016 0420   LYMPHSABS 1.9 11/14/2011 1403   MONOABS 0.7 11/14/2011 1403   EOSABS 0.3 11/14/2011 1403   BASOSABS 0.1 11/14/2011 1403    BMET    Component Value Date/Time   NA 134 (L) 05/11/2016 0420   K 4.3 05/11/2016 0420   CL 102 05/11/2016 0420   CO2 26 05/11/2016 0420   GLUCOSE 161 (H) 05/11/2016 0420   BUN 9 05/11/2016 0420   CREATININE 0.78 05/11/2016 0420   CALCIUM 8.4 (L) 05/11/2016 0420   GFRNONAA >60 05/11/2016 0420   GFRAA >60 05/11/2016 0420    INR    Component Value Date/Time   INR 1.15 05/10/2016 1245     Intake/Output Summary (Last 24 hours) at 05/12/16 1027 Last data filed at 05/12/16 0429  Gross per 24 hour  Intake              400 ml  Output             1000 ml  Net             -600 ml     Assessment/Plan:  60 y.o. male is s/p  Aortobifemoral bypass grafting 2 Days Post-Op  -pt with patent bypass grafting with palpable  pedal pulses bilaterally -pt with +BS but no flatus-will order dulcolax suppository.  Continue npo for now -pain is not well controlled.  Receiving morphine every 2 hrs while awake (every 4 hrs while sleeping).  May benefit from some Toradol.  No asa allergy and renal function is okay.   Will d/w Dr. Scot Dock  -continue to mobilize.  He was up in the chair this morning, but has not really walked.  Should walk more if pain is under more control   Leontine Locket, PA-C [facility] [phone] 05/12/2016 10:27 AM  ADDENDUM: Discussed with Dr. Scot Dock - will add Toradol 30mg  q6h for pain x 3 days.  This should help with pain and helping him mobilize.  First dose for now.  Leontine Locket, [facility] 05/12/2016 11:26 AM   I have interviewed the patient and examined the patient. I agree with the findings by the PA. Has some bowel sounds. Incisions look good.  Start diet in AM (no results from Dulcolax yet)  Gae Gallop, MD  [phone]   

## 2016-05-13 ENCOUNTER — Encounter (HOSPITAL_COMMUNITY): Payer: Self-pay | Admitting: Vascular Surgery

## 2016-05-13 LAB — TYPE AND SCREEN
ABO/RH(D): A NEG
Antibody Screen: NEGATIVE
UNIT DIVISION: 0
Unit division: 0
Unit division: 0
Unit division: 0

## 2016-05-13 MED ORDER — OXYCODONE-ACETAMINOPHEN 5-325 MG PO TABS
1.0000 | ORAL_TABLET | ORAL | Status: DC | PRN
  Administered 2016-05-13 – 2016-05-14 (×5): 2 via ORAL
  Filled 2016-05-13 (×5): qty 2

## 2016-05-13 MED ORDER — FAMOTIDINE 20 MG PO TABS
20.0000 mg | ORAL_TABLET | Freq: Two times a day (BID) | ORAL | Status: DC
  Administered 2016-05-13 (×2): 20 mg via ORAL
  Filled 2016-05-13: qty 1

## 2016-05-13 NOTE — Progress Notes (Addendum)
  [facility] Progress Note  Subjective  - POD #3  Pain is about the same. Denies nausea. Had 2 BMs yesterday. Passing flatus.   Objective Vitals:   05/13/16 0100 05/13/16 0455  BP: 140/62 (!) 135/54  Pulse: 72 66  Resp:  18  Temp:  98.4 F (36.9 C)   No intake or output data in the 24 hours ending 05/13/16 0723  Abdomen with mild diffuse tenderness to palpation. No distension. Midline incision and groin incisions c/d/i. Palpable DP and PT pulses bilaterally  Assessment/Planning: 60 y.o. male is s/p: aortobifemoral bypass 3 Days Post-Op   Had 2 BMs yesterday. Tolerated full liquids. Advance diet today.  Pain ok. Started on toradol yesterday. Start po pain meds. Mobilize.  Home once pain well controlled on po pain meds and tolerating regular diet. Anticipate next 1-2 days.   Alvia Grove 05/13/2016 7:23 AM --  Laboratory CBC    Component Value Date/Time   WBC 12.8 (H) 05/11/2016 0420   HGB 12.9 (L) 05/11/2016 0420   HCT 40.5 05/11/2016 0420   PLT 164 05/11/2016 0420    BMET    Component Value Date/Time   NA 134 (L) 05/11/2016 0420   K 4.3 05/11/2016 0420   CL 102 05/11/2016 0420   CO2 26 05/11/2016 0420   GLUCOSE 161 (H) 05/11/2016 0420   BUN 9 05/11/2016 0420   CREATININE 0.78 05/11/2016 0420   CALCIUM 8.4 (L) 05/11/2016 0420   GFRNONAA >60 05/11/2016 0420   GFRAA >60 05/11/2016 0420    COAG Lab Results  Component Value Date   INR 1.15 05/10/2016   INR 1.05 05/02/2016   INR 1.08 05/17/2011   No results found for: PTT  Antibiotics Anti-infectives    Start     Dose/Rate Route Frequency Ordered Stop   05/10/16 1930  cefUROXime (ZINACEF) 1.5 g in dextrose 5 % 50 mL IVPB     1.5 g 100 mL/hr over 30 Minutes Intravenous Every 12 hours 05/10/16 1501 05/11/16 0830   05/10/16 0607  dextrose 5 % with cefUROXime (ZINACEF) ADS Med    Comments:  Leandrew Koyanagi   : cabinet override      05/10/16 0607 05/10/16 1814   05/10/16 0605   cefUROXime (ZINACEF) 1.5 g in dextrose 5 % 50 mL IVPB     1.5 g 100 mL/hr over 30 Minutes Intravenous 30 min pre-op 05/10/16 0605 05/10/16 [place], PA-C [facility] Office: [phone] Pager: [phone] 05/13/2016 7:23 AM    I have examined the patient, reviewed and agree with above.Doing well overall. Does walk without difficulty. Abdominal both groin incisions are healing quite nicely. 2-3+ dorsalis pedis pulses bilaterally. Probable discharge in a.m.  Curt Jews, MD 05/13/2016 3:29 PM

## 2016-05-13 NOTE — Progress Notes (Signed)
Physical Therapy Treatment and D/C Patient Details Name: Gary [name] MRN: [id number] DOB: July 25, 1956 Today's Date: 05/13/2016    History of Present Illness Patient is a 60 yo male admitted 05/10/16 with severe aortoiliac occlusive disease.  Patient with intolerable claudication, unable to walk any distance.   PMH:  claudication, depression, abdominal injury 2012    PT Comments    Pt admitted with above diagnosis. Pt currently without further functional limitations with pt functioning at baseline at independent level with all mobility including dynamic balance.  Pt does not need further skilled PT at this time.  Will sign off as pt met 4/5 goals.  Refused to practice stairs saying "I can do that no problem."    Follow Up Recommendations  No PT follow up;Supervision for mobility/OOB     Equipment Recommendations  None recommended by PT    Recommendations for Other Services       Precautions / Restrictions Precautions Precautions: None Restrictions Weight Bearing Restrictions: No    Mobility  Bed Mobility Overal bed mobility: Independent                Transfers Overall transfer level: Independent                  Ambulation/Gait Ambulation/Gait assistance: Independent Ambulation Distance (Feet): 450 Feet Assistive device: None Gait Pattern/deviations: Step-through pattern;Decreased stride length Gait velocity: slightly decreased Gait velocity interpretation: <1.8 ft/sec, indicative of risk for recurrent falls General Gait Details: Patient with good gait pattern and balance.  Slightly slower speed.  No loss of balance with head turns during gait and with challenges to balance with no UE support.   Stairs Stairs:  (declined steps practice)          Wheelchair Mobility    Modified Rankin (Stroke Patients Only)       Balance Overall balance assessment: Independent                                  Cognition Arousal/Alertness:  Awake/alert Behavior During Therapy: WFL for tasks assessed/performed Overall Cognitive Status: Within Functional Limits for tasks assessed                      Exercises      General Comments General comments (skin integrity, edema, etc.): 24/24 on DGI. No balance issues noted.       Pertinent Vitals/Pain Pain Assessment: No/denies painVSS    Home Living                      Prior Function            PT Goals (current goals can now be found in the care plan section) Acute Rehab PT Goals PT Goal Formulation: All assessment and education complete, DC therapy Progress towards PT goals: Goals met/education completed, patient discharged from PT    Frequency       PT Plan Current plan remains appropriate    Co-evaluation             End of Session Equipment Utilized During Treatment: Gait belt Activity Tolerance: Patient tolerated treatment well Patient left: in bed;with call bell/phone within reach     Time: 2778-2423 PT Time Calculation (min) (ACUTE ONLY): 9 min  Charges:  $Gait Training: 8-22 mins  G CodesDenice Paradise 05/13/2016, 5:08 PM  [employer] Acute Rehabilitation [phone] (pager)

## 2016-05-14 ENCOUNTER — Telehealth: Payer: Self-pay | Admitting: Vascular Surgery

## 2016-05-14 MED ORDER — OXYCODONE-ACETAMINOPHEN 5-325 MG PO TABS: 1.0000 | ORAL_TABLET | ORAL | 0 refills | Status: DC | PRN

## 2016-05-14 MED ORDER — ATORVASTATIN CALCIUM 10 MG PO TABS: 10.0000 mg | ORAL_TABLET | Freq: Every day | ORAL | Status: DC

## 2016-05-14 NOTE — Telephone Encounter (Signed)
-----   Message from Denman George, RN sent at 05/14/2016 10:39 AM EDT ----- Regarding: needs 2 wk. f/u with Dr. Donnetta Hutching   ----- Message ----- From: Alvia Grove, PA-C Sent: 05/14/2016   7:21 AM To: [facility] Charge Pool  S/p aortobifemoral bypass 05/10/16  F/u with Dr. Donnetta Hutching in 2 weeks  Thanks Maudie Mercury

## 2016-05-14 NOTE — Progress Notes (Addendum)
  [facility] Progress Note  Subjective  - POD #1  Ready to go home. Denies abd pain and nausea.   Objective Vitals:   05/14/16 0432 05/14/16 0646  BP: (!) 152/61   Pulse: 62 62  Resp: 20   Temp: 97.7 F (36.5 C)     Intake/Output Summary (Last 24 hours) at 05/14/16 [id number] Last data filed at 05/13/16 1700  Gross per 24 hour  Intake              600 ml  Output                0 ml  Net              600 ml   Abdomen soft and non tender Incision c/d/i Palpable DP and PT pulses bilaterally   Assessment/Planning: 60 y.o. male is s/p: Aortobifemoral bypass 4 Days Post-Op   Tolerated regular diet Pain well controlled on po pain meds.  Ambulating well.  D/c home today.   Alvia Grove 05/14/2016 7:18 AM --  Laboratory CBC    Component Value Date/Time   WBC 12.8 (H) 05/11/2016 0420   HGB 12.9 (L) 05/11/2016 0420   HCT 40.5 05/11/2016 0420   PLT 164 05/11/2016 0420    BMET    Component Value Date/Time   NA 134 (L) 05/11/2016 0420   K 4.3 05/11/2016 0420   CL 102 05/11/2016 0420   CO2 26 05/11/2016 0420   GLUCOSE 161 (H) 05/11/2016 0420   BUN 9 05/11/2016 0420   CREATININE 0.78 05/11/2016 0420   CALCIUM 8.4 (L) 05/11/2016 0420   GFRNONAA >60 05/11/2016 0420   GFRAA >60 05/11/2016 0420    COAG Lab Results  Component Value Date   INR 1.15 05/10/2016   INR 1.05 05/02/2016   INR 1.08 05/17/2011   No results found for: PTT  Antibiotics Anti-infectives    Start     Dose/Rate Route Frequency Ordered Stop   05/10/16 1930  cefUROXime (ZINACEF) 1.5 g in dextrose 5 % 50 mL IVPB     1.5 g 100 mL/hr over 30 Minutes Intravenous Every 12 hours 05/10/16 1501 05/11/16 0830   05/10/16 0607  dextrose 5 % with cefUROXime (ZINACEF) ADS Med    Comments:  Leandrew Koyanagi   : cabinet override      05/10/16 0607 05/10/16 1814   05/10/16 0605  cefUROXime (ZINACEF) 1.5 g in dextrose 5 % 50 mL IVPB     1.5 g 100 mL/hr over 30 Minutes Intravenous 30 min  pre-op 05/10/16 0605 05/10/16 [place], PA-C [facility] Office: [phone] Pager: [phone] 05/14/2016 7:18 AM    I have examined the patient, reviewed and agree with above.  Curt Jews, MD 05/14/2016 8:58 AM

## 2016-05-14 NOTE — Telephone Encounter (Signed)
Sched appt 8/22 at 9:15. Spoke to pt's daughter to inform them of appt.

## 2016-05-14 NOTE — Progress Notes (Signed)
Discussed with the patient and all questioned fully answered. He will call me if any problems arise.  Telemetry removed, CCMD notified. IV removed. Pt given paper prescription for percocet. Educated on wound care, follow up, and when to call MD r/t s/s of infection.   Martie Lee Bumbledare
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
## 2016-05-24 ENCOUNTER — Encounter: Payer: Self-pay | Admitting: Family

## 2016-05-24 ENCOUNTER — Ambulatory Visit (INDEPENDENT_AMBULATORY_CARE_PROVIDER_SITE_OTHER): Payer: Medicare Other | Admitting: Family

## 2016-05-24 VITALS — BP 148/72 | HR 63 | Temp 98.8°F | Resp 18 | Ht 70.0 in | Wt 149.6 lb

## 2016-05-24 DIAGNOSIS — I7409 Other arterial embolism and thrombosis of abdominal aorta: Secondary | ICD-10-CM

## 2016-05-24 DIAGNOSIS — Z720 Tobacco use: Secondary | ICD-10-CM

## 2016-05-24 DIAGNOSIS — F17200 Nicotine dependence, unspecified, uncomplicated: Secondary | ICD-10-CM

## 2016-05-24 MED ORDER — OXYCODONE-ACETAMINOPHEN 5-325 MG PO TABS: 1.0000 | ORAL_TABLET | ORAL | 0 refills | Status: DC | PRN

## 2016-05-24 NOTE — Patient Instructions (Signed)
Peripheral Vascular Disease Peripheral vascular disease (PVD) is a disease of the blood vessels that are not part of your heart and brain. A simple term for PVD is poor circulation. In most cases, PVD narrows the blood vessels that carry blood from your heart to the rest of your body. This can result in a decreased supply of blood to your arms, legs, and internal organs, like your stomach or kidneys. However, it most often affects a person's lower legs and feet. There are two types of PVD.  Organic PVD. This is the more common type. It is caused by damage to the structure of blood vessels.  Functional PVD. This is caused by conditions that make blood vessels contract and tighten (spasm). Without treatment, PVD tends to get worse over time. PVD can also lead to acute ischemic limb. This is when an arm or limb suddenly has trouble getting enough blood. This is a medical emergency. CAUSES Each type of PVD has many different causes. The most common cause of PVD is buildup of a fatty material (plaque) inside of your arteries (atherosclerosis). Small amounts of plaque can break off from the walls of the blood vessels and become lodged in a smaller artery. This blocks blood flow and can cause acute ischemic limb. Other common causes of PVD include:  Blood clots that form inside of blood vessels.  Injuries to blood vessels.  Diseases that cause inflammation of blood vessels or cause blood vessel spasms.  Health behaviors and health history that increase your risk of developing PVD. RISK FACTORS  You may have a greater risk of PVD if you:  Have a family history of PVD.  Have certain medical conditions, including:  High cholesterol.  Diabetes.  High blood pressure (hypertension).  Coronary heart disease.  Past problems with blood clots.  Past injury, such as burns or a broken bone. These may have damaged blood vessels in your limbs.  Buerger disease. This is caused by inflamed blood  vessels in your hands and feet.  Some forms of arthritis.  Rare birth defects that affect the arteries in your legs.  Use tobacco.  Do not get enough exercise.  Are obese.  Are age 50 or older. SIGNS AND SYMPTOMS  PVD may cause many different symptoms. Your symptoms depend on what part of your body is not getting enough blood. Some common signs and symptoms include:  Cramps in your lower legs. This may be a symptom of poor leg circulation (claudication).  Pain and weakness in your legs while you are physically active that goes away when you rest (intermittent claudication).  Leg pain when at rest.  Leg numbness, tingling, or weakness.  Coldness in a leg or foot, especially when compared with the other leg.  Skin or hair changes. These can include:  Hair loss.  Shiny skin.  Pale or bluish skin.  Thick toenails.  Inability to get or maintain an erection (erectile dysfunction). People with PVD are more prone to developing ulcers and sores on their toes, feet, or legs. These may take longer than normal to heal. DIAGNOSIS Your health care provider may diagnose PVD from your signs and symptoms. The health care provider will also do a physical exam. You may have tests to find out what is causing your PVD and determine its severity. Tests may include:  Blood pressure recordings from your arms and legs and measurements of the strength of your pulses (pulse volume recordings).  Imaging studies using sound waves to take pictures of   the blood flow through your blood vessels (Doppler ultrasound).  Injecting a dye into your blood vessels before having imaging studies using:  X-rays (angiogram or arteriogram).  Computer-generated X-rays (CT angiogram).  A powerful electromagnetic field and a computer (magnetic resonance angiogram or MRA). TREATMENT Treatment for PVD depends on the cause of your condition and the severity of your symptoms. It also depends on your age. Underlying  causes need to be treated and controlled. These include long-lasting (chronic) conditions, such as diabetes, high cholesterol, and high blood pressure. You may need to first try making lifestyle changes and taking medicines. Surgery may be needed if these do not work. Lifestyle changes may include:  Quitting smoking.  Exercising regularly.  Following a low-fat, low-cholesterol diet. Medicines may include:  Blood thinners to prevent blood clots.  Medicines to improve blood flow.  Medicines to improve your blood cholesterol levels. Surgical procedures may include:  A procedure that uses an inflated balloon to open a blocked artery and improve blood flow (angioplasty).  A procedure to put in a tube (stent) to keep a blocked artery open (stent implant).  Surgery to reroute blood flow around a blocked artery (peripheral bypass surgery).  Surgery to remove dead tissue from an infected wound on the affected limb.  Amputation. This is surgical removal of the affected limb. This may be necessary in cases of acute ischemic limb that are not improved through medical or surgical treatments. HOME CARE INSTRUCTIONS  Take medicines only as directed by your health care provider.  Do not use any tobacco products, including cigarettes, chewing tobacco, or electronic cigarettes. If you need help quitting, ask your health care provider.  Lose weight if you are overweight, and maintain a healthy weight as directed by your health care provider.  Eat a diet that is low in fat and cholesterol. If you need help, ask your health care provider.  Exercise regularly. Ask your health care provider to suggest some good activities for you.  Use compression stockings or other mechanical devices as directed by your health care provider.  Take good care of your feet.  Wear comfortable shoes that fit well.  Check your feet often for any cuts or sores. SEEK MEDICAL CARE IF:  You have cramps in your legs  while walking.  You have leg pain when you are at rest.  You have coldness in a leg or foot.  Your skin changes.  You have erectile dysfunction.  You have cuts or sores on your feet that are not healing. SEEK IMMEDIATE MEDICAL CARE IF:  Your arm or leg turns cold and blue.  Your arms or legs become red, warm, swollen, painful, or numb.  You have chest pain or trouble breathing.  You suddenly have weakness in your face, arm, or leg.  You become very confused or lose the ability to speak.  You suddenly have a very bad headache or lose your vision.   This information is not intended to replace advice given to you by your health care provider. Make sure you discuss any questions you have with your health care provider.   Document Released: 10/31/2004 Document Revised: 10/14/2014 Document Reviewed: 03/03/2014 Elsevier Interactive Patient Education 2016 [employer]     Steps to Quit Smoking  Smoking tobacco can be harmful to your health and can affect almost every organ in your body. Smoking puts you, and those around you, at risk for developing many serious chronic diseases. Quitting smoking is difficult, but it is one of   the best things that you can do for your health. It is never too late to quit. WHAT ARE THE BENEFITS OF QUITTING SMOKING? When you quit smoking, you lower your risk of developing serious diseases and conditions, such as:  Lung cancer or lung disease, such as COPD.  Heart disease.  Stroke.  Heart attack.  Infertility.  Osteoporosis and bone fractures. Additionally, symptoms such as coughing, wheezing, and shortness of breath may get better when you quit. You may also find that you get sick less often because your body is stronger at fighting off colds and infections. If you are pregnant, quitting smoking can help to reduce your chances of having a baby of low birth weight. HOW DO I GET READY TO QUIT? When you decide to quit smoking, create a plan to  make sure that you are successful. Before you quit:  Pick a date to quit. Set a date within the next two weeks to give you time to prepare.  Write down the reasons why you are quitting. Keep this list in places where you will see it often, such as on your bathroom mirror or in your car or wallet.  Identify the people, places, things, and activities that make you want to smoke (triggers) and avoid them. Make sure to take these actions:  Throw away all cigarettes at home, at work, and in your car.  Throw away smoking accessories, such as ashtrays and lighters.  Clean your car and make sure to empty the ashtray.  Clean your home, including curtains and carpets.  Tell your family, friends, and coworkers that you are quitting. Support from your loved ones can make quitting easier.  Talk with your health care provider about your options for quitting smoking.  Find out what treatment options are covered by your health insurance. WHAT STRATEGIES CAN I USE TO QUIT SMOKING?  Talk with your healthcare provider about different strategies to quit smoking. Some strategies include:  Quitting smoking altogether instead of gradually lessening how much you smoke over a period of time. Research shows that quitting "cold turkey" is more successful than gradually quitting.  Attending in-person counseling to help you build problem-solving skills. You are more likely to have success in quitting if you attend several counseling sessions. Even short sessions of 10 minutes can be effective.  Finding resources and support systems that can help you to quit smoking and remain smoke-free after you quit. These resources are most helpful when you use them often. They can include:  Online chats with a counselor.  Telephone quitlines.  Printed self-help materials.  Support groups or group counseling.  Text messaging programs.  Mobile phone applications.  Taking medicines to help you quit smoking. (If you are  pregnant or breastfeeding, talk with your health care provider first.) Some medicines contain nicotine and some do not. Both types of medicines help with cravings, but the medicines that include nicotine help to relieve withdrawal symptoms. Your health care provider may recommend:  Nicotine patches, gum, or lozenges.  Nicotine inhalers or sprays.  Non-nicotine medicine that is taken by mouth. Talk with your health care provider about combining strategies, such as taking medicines while you are also receiving in-person counseling. Using these two strategies together makes you more likely to succeed in quitting than if you used either strategy on its own. If you are pregnant or breastfeeding, talk with your health care provider about finding counseling or other support strategies to quit smoking. Do not take medicine to help you   quit smoking unless told to do so by your health care provider. WHAT THINGS CAN I DO TO MAKE IT EASIER TO QUIT? Quitting smoking might feel overwhelming at first, but there is a lot that you can do to make it easier. Take these important actions:  Reach out to your family and friends and ask that they support and encourage you during this time. Call telephone quitlines, reach out to support groups, or work with a counselor for support.  Ask people who smoke to avoid smoking around you.  Avoid places that trigger you to smoke, such as bars, parties, or smoke-break areas at work.  Spend time around people who do not smoke.  Lessen stress in your life, because stress can be a smoking trigger for some people. To lessen stress, try:  Exercising regularly.  Deep-breathing exercises.  Yoga.  Meditating.  Performing a body scan. This involves closing your eyes, scanning your body from head to toe, and noticing which parts of your body are particularly tense. Purposefully relax the muscles in those areas.  Download or purchase mobile phone or tablet apps (applications)  that can help you stick to your quit plan by providing reminders, tips, and encouragement. There are many free apps, such as QuitGuide from the CDC (Centers for Disease Control and Prevention). You can find other support for quitting smoking (smoking cessation) through smokefree.gov and other websites. HOW WILL I FEEL WHEN I QUIT SMOKING? Within the first 24 hours of quitting smoking, you may start to feel some withdrawal symptoms. These symptoms are usually most noticeable 2-3 days after quitting, but they usually do not last beyond 2-3 weeks. Changes or symptoms that you might experience include:  Mood swings.  Restlessness, anxiety, or irritation.  Difficulty concentrating.  Dizziness.  Strong cravings for sugary foods in addition to nicotine.  Mild weight gain.  Constipation.  Nausea.  Coughing or a sore throat.  Changes in how your medicines work in your body.  A depressed mood.  Difficulty sleeping (insomnia). After the first 2-3 weeks of quitting, you may start to notice more positive results, such as:  Improved sense of smell and taste.  Decreased coughing and sore throat.  Slower heart rate.  Lower blood pressure.  Clearer skin.  The ability to breathe more easily.  Fewer sick days. Quitting smoking is very challenging for most people. Do not get discouraged if you are not successful the first time. Some people need to make many attempts to quit before they achieve long-term success. Do your best to stick to your quit plan, and talk with your health care provider if you have any questions or concerns.   This information is not intended to replace advice given to you by your health care provider. Make sure you discuss any questions you have with your health care provider.   Document Released: 09/17/2001 Document Revised: 02/07/2015 Document Reviewed: 02/07/2015 Elsevier Interactive Patient Education 2016 [employer] Inc.  

## 2016-05-24 NOTE — Progress Notes (Signed)
    Postoperative Visit   History of Present Illness  Gary [name] is a 60 y.o. year old male patient of Dr. Donnetta Hutching who is s/p Aortobifemoral bypass with 14 x 8 Hemashield graft on 05/10/16 for Aortoiliac occlusive disease.  He is scheduled to see Dr. Donnetta Hutching on 05/28/16 for 2 weeks follow up.  He returns today with c/o a knot in his right groin that he noticed a few days ago and bilateral groin pain.   The patient notes resolution of lower extremity symptoms.  The patient is able to complete their activities of daily living.    For VQI Use Only  PRE-ADM LIVING: Home  AMB STATUS: Ambulatory  Physical Examination  Vitals:   05/24/16 1234 05/24/16 1237  BP: (!) 151/76 (!) 148/72  Pulse: 63   Resp: 18   Temp: 98.8 F (37.1 C)   TempSrc: Oral   SpO2: 100%   Weight: 149 lb 9.6 oz (67.9 kg)   Height: 5\' 10"  (1.778 m)    Body mass index is 21.47 kg/m.  Both groin incision edges are well proximated and healing, no erythema, no drainage, minimal swelling at groins; pedal pulses are palpable bilaterally. Bilateral femoral pulses are palpable at 3+.  Medical Decision Making  Gary [name] is a 60 y.o. year old male who presents s/p Aortobifemoral bypass with 14 x 8 Hemashield graft on 05/10/16 for Aortoiliac occlusive disease. There are no signs of infection at his incisions, minimal swelling in both groins with likely small hematomas. He states he ran out of oxycodone/APAP 2 days ago, pain in low abdomen and both groins is 7-8/10, states his pain is under control taking the analgesics.   Oxycodone /APA 5/325 prescribed: 1 tab po q4 hours prn pain, disp #10, 0 refills.   The patient was counseled re smoking cessation and given several free resources re smoking cessation. Follow up with Dr. Donnetta Hutching as scheduled on 05/28/16.   The patient's bypass incisions are healing appropriately with resolution of pre-operative symptoms. I discussed in depth with the patient the nature of  atherosclerosis, and emphasized the importance of maximal medical management including strict control of blood pressure, blood glucose, and lipid levels, obtaining regular exercise, and cessation of smoking.  The patient is aware that without maximal medical management the underlying atherosclerotic disease process will progress, limiting the benefit of any interventions.  Thank you for allowing [place] to participate in this patient's care.  Shanaya Schneck, Sharmon Leyden, RN, MSN, FNP-C [facility] Office: [phone]  05/24/2016, 12:50 PM  Clinic MD: Donzetta Matters

## 2016-05-24 NOTE — Progress Notes (Signed)
Vitals:   05/24/16 1234 05/24/16 1237  BP: (!) 151/76 (!) 148/72  Pulse: 63   Resp: 18   Temp: 98.8 F (37.1 C)   TempSrc: Oral   SpO2: 100%   Weight: 149 lb 9.6 oz (67.9 kg)   Height: 5\' 10"  (1.778 m)

## 2016-05-27 ENCOUNTER — Encounter: Payer: Self-pay | Admitting: Vascular Surgery

## 2016-05-28 ENCOUNTER — Encounter: Payer: Self-pay | Admitting: Vascular Surgery

## 2016-05-30 ENCOUNTER — Telehealth: Payer: Self-pay | Admitting: Orthopaedic Surgery

## 2016-05-30 ENCOUNTER — Encounter: Payer: Self-pay | Admitting: Vascular Surgery

## 2016-05-30 MED ORDER — HYDROCODONE-ACETAMINOPHEN 5-325 MG PO TABS: 1.0000 | ORAL_TABLET | Freq: Four times a day (QID) | ORAL | 0 refills | Status: DC | PRN

## 2016-05-30 NOTE — Telephone Encounter (Signed)
Patient requests refill for: HYDROcodone-acetaminophen (NORCO/VICODIN) 5-325 MG tablet quantity 120

## 2016-06-13 ENCOUNTER — Encounter: Payer: Self-pay | Admitting: Vascular Surgery

## 2016-06-17 ENCOUNTER — Telehealth: Payer: Self-pay

## 2016-06-17 NOTE — Telephone Encounter (Signed)
Attempted to return call to pt. Following message left on Triage Line that pt. has blood and pus draining from his incision.  Unable to reach pt. or leave a message, as no voice mail has been set-up.

## 2016-06-17 NOTE — Telephone Encounter (Signed)
Spoke to daughter, Glenard Haring at [phone], regarding patient's incision ( post aortobifem by Dr. Donnetta Hutching). According to Glenard Haring this may just be a stitch abscess but we need to see him. I told them to come in tomorrow morning at 8:45 and our NP will check. Dr. Donnetta Hutching is also in the office tomorrow.

## 2016-06-18 ENCOUNTER — Ambulatory Visit (INDEPENDENT_AMBULATORY_CARE_PROVIDER_SITE_OTHER): Payer: Medicare Other | Admitting: Family

## 2016-06-18 ENCOUNTER — Encounter: Payer: Self-pay | Admitting: Family

## 2016-06-18 VITALS — BP 114/70 | HR 67 | Temp 97.6°F | Ht 70.0 in | Wt 149.0 lb

## 2016-06-18 DIAGNOSIS — Z720 Tobacco use: Secondary | ICD-10-CM

## 2016-06-18 DIAGNOSIS — Z95828 Presence of other vascular implants and grafts: Secondary | ICD-10-CM

## 2016-06-18 DIAGNOSIS — I7409 Other arterial embolism and thrombosis of abdominal aorta: Secondary | ICD-10-CM

## 2016-06-18 DIAGNOSIS — F17200 Nicotine dependence, unspecified, uncomplicated: Secondary | ICD-10-CM

## 2016-06-18 NOTE — Progress Notes (Signed)
    Postoperative Visit   History of Present Illness  Gary [name] is a 60 y.o. year old male patient of Dr. Donnetta Hutching who is s/p Aortobifemoral bypass with 14 x 8 Hemashield graft on 05/10/16 for Aortoiliac occlusive disease.  He is scheduled to see Dr. Donnetta Hutching on 06/19/16 for follow up.  He returns today with c/o a small pocket of pus on his abdominal incision that has ruptured, drained. He denies fever or chills. He is walking long distances with no claudication and is pleased.   The patient notes resolution of lower extremity symptoms.  The patient is able to complete their activities of daily living.    For VQI Use Only  PRE-ADM LIVING: Home  AMB STATUS: Ambulatory   Physical Examination  Vitals:   06/18/16 0934  BP: 114/70  Pulse: 67  Temp: 97.6 F (36.4 C)  TempSrc: Oral  SpO2: 96%  Weight: 149 lb (67.6 kg)  Height: 5\' 10"  (1.778 m)   Body mass index is 21.38 kg/m.    Medical Decision Making  Gary [name] is a 60 y.o. year old male who is s/p Aortobifemoral bypass with 14 x 8 Hemashield graft on 05/10/16 for Aortoiliac occlusive disease. He seemed to have had a suture abscess that drained. His midline abdominal incision and bilateral groin incisions are well healed. Slight crusting of about 8 mm at the proximal end of the midline abdominal incision where there was likely a suture abscess that ruptured. Bilateral DP and PT pulses are palpable.   Unfortunately he continues to smoke but seems receptive to quitting. He was counseled re smoking cessation and given several free resources re smoking cessation.   The patient's bypass incisions are healing appropriately with resolution of pre-operative symptoms. I discussed in depth with the patient the nature of atherosclerosis, and emphasized the importance of maximal medical management including strict control of blood pressure, blood glucose, and lipid levels, obtaining regular exercise, and cessation of smoking.   The patient is aware that without maximal medical management the underlying atherosclerotic disease process will progress, limiting the benefit of any interventions.  The patient's surveillance will included ABI and bypass duplex studies which will be completed in: 3 months, at which time the patient will be re-evaluated.   Cancel tomorrow's appointment with Dr. Donnetta Hutching since Dr. Donnetta Hutching spoke with and examined pt today.  I emphasized the importance of routine surveillance of the patient's bypass, as the vascular surgery literature emphasize the improved patency possible with assisted primary patency procedures versus secondary patency procedures. The patient agrees to participate in their maximal medical care and routine surveillance.  Thank you for allowing [place] to participate in this patient's care.  Renalda Locklin, Sharmon Leyden, RN, MSN, FNP-C [facility] Office: [phone]  06/18/2016, 10:08 AM  Clinic MD: Early

## 2016-06-18 NOTE — Patient Instructions (Addendum)
Peripheral Vascular Disease Peripheral vascular disease (PVD) is a disease of the blood vessels that are not part of your heart and brain. A simple term for PVD is poor circulation. In most cases, PVD narrows the blood vessels that carry blood from your heart to the rest of your body. This can result in a decreased supply of blood to your arms, legs, and internal organs, like your stomach or kidneys. However, it most often affects a person's lower legs and feet. There are two types of PVD.  Organic PVD. This is the more common type. It is caused by damage to the structure of blood vessels.  Functional PVD. This is caused by conditions that make blood vessels contract and tighten (spasm). Without treatment, PVD tends to get worse over time. PVD can also lead to acute ischemic limb. This is when an arm or limb suddenly has trouble getting enough blood. This is a medical emergency. CAUSES Each type of PVD has many different causes. The most common cause of PVD is buildup of a fatty material (plaque) inside of your arteries (atherosclerosis). Small amounts of plaque can break off from the walls of the blood vessels and become lodged in a smaller artery. This blocks blood flow and can cause acute ischemic limb. Other common causes of PVD include:  Blood clots that form inside of blood vessels.  Injuries to blood vessels.  Diseases that cause inflammation of blood vessels or cause blood vessel spasms.  Health behaviors and health history that increase your risk of developing PVD. RISK FACTORS  You may have a greater risk of PVD if you:  Have a family history of PVD.  Have certain medical conditions, including:  High cholesterol.  Diabetes.  High blood pressure (hypertension).  Coronary heart disease.  Past problems with blood clots.  Past injury, such as burns or a broken bone. These may have damaged blood vessels in your limbs.  Buerger disease. This is caused by inflamed blood  vessels in your hands and feet.  Some forms of arthritis.  Rare birth defects that affect the arteries in your legs.  Use tobacco.  Do not get enough exercise.  Are obese.  Are age 50 or older. SIGNS AND SYMPTOMS  PVD may cause many different symptoms. Your symptoms depend on what part of your body is not getting enough blood. Some common signs and symptoms include:  Cramps in your lower legs. This may be a symptom of poor leg circulation (claudication).  Pain and weakness in your legs while you are physically active that goes away when you rest (intermittent claudication).  Leg pain when at rest.  Leg numbness, tingling, or weakness.  Coldness in a leg or foot, especially when compared with the other leg.  Skin or hair changes. These can include:  Hair loss.  Shiny skin.  Pale or bluish skin.  Thick toenails.  Inability to get or maintain an erection (erectile dysfunction). People with PVD are more prone to developing ulcers and sores on their toes, feet, or legs. These may take longer than normal to heal. DIAGNOSIS Your health care provider may diagnose PVD from your signs and symptoms. The health care provider will also do a physical exam. You may have tests to find out what is causing your PVD and determine its severity. Tests may include:  Blood pressure recordings from your arms and legs and measurements of the strength of your pulses (pulse volume recordings).  Imaging studies using sound waves to take pictures of   the blood flow through your blood vessels (Doppler ultrasound).  Injecting a dye into your blood vessels before having imaging studies using:  X-rays (angiogram or arteriogram).  Computer-generated X-rays (CT angiogram).  A powerful electromagnetic field and a computer (magnetic resonance angiogram or MRA). TREATMENT Treatment for PVD depends on the cause of your condition and the severity of your symptoms. It also depends on your age. Underlying  causes need to be treated and controlled. These include long-lasting (chronic) conditions, such as diabetes, high cholesterol, and high blood pressure. You may need to first try making lifestyle changes and taking medicines. Surgery may be needed if these do not work. Lifestyle changes may include:  Quitting smoking.  Exercising regularly.  Following a low-fat, low-cholesterol diet. Medicines may include:  Blood thinners to prevent blood clots.  Medicines to improve blood flow.  Medicines to improve your blood cholesterol levels. Surgical procedures may include:  A procedure that uses an inflated balloon to open a blocked artery and improve blood flow (angioplasty).  A procedure to put in a tube (stent) to keep a blocked artery open (stent implant).  Surgery to reroute blood flow around a blocked artery (peripheral bypass surgery).  Surgery to remove dead tissue from an infected wound on the affected limb.  Amputation. This is surgical removal of the affected limb. This may be necessary in cases of acute ischemic limb that are not improved through medical or surgical treatments. HOME CARE INSTRUCTIONS  Take medicines only as directed by your health care provider.  Do not use any tobacco products, including cigarettes, chewing tobacco, or electronic cigarettes. If you need help quitting, ask your health care provider.  Lose weight if you are overweight, and maintain a healthy weight as directed by your health care provider.  Eat a diet that is low in fat and cholesterol. If you need help, ask your health care provider.  Exercise regularly. Ask your health care provider to suggest some good activities for you.  Use compression stockings or other mechanical devices as directed by your health care provider.  Take good care of your feet.  Wear comfortable shoes that fit well.  Check your feet often for any cuts or sores. SEEK MEDICAL CARE IF:  You have cramps in your legs  while walking.  You have leg pain when you are at rest.  You have coldness in a leg or foot.  Your skin changes.  You have erectile dysfunction.  You have cuts or sores on your feet that are not healing. SEEK IMMEDIATE MEDICAL CARE IF:  Your arm or leg turns cold and blue.  Your arms or legs become red, warm, swollen, painful, or numb.  You have chest pain or trouble breathing.  You suddenly have weakness in your face, arm, or leg.  You become very confused or lose the ability to speak.  You suddenly have a very bad headache or lose your vision.   This information is not intended to replace advice given to you by your health care provider. Make sure you discuss any questions you have with your health care provider.   Document Released: 10/31/2004 Document Revised: 10/14/2014 Document Reviewed: 03/03/2014 Elsevier Interactive Patient Education 2016 [employer] Inc[employer]     Steps to Quit Smoking  Smoking tobacco can be harmful to your health and can affect almost every organ in your body. Smoking puts you, and those around you, at risk for developing many serious chronic diseases. Quitting smoking is difficult, but it is one of   the best things that you can do for your health. It is never too late to quit. WHAT ARE THE BENEFITS OF QUITTING SMOKING? When you quit smoking, you lower your risk of developing serious diseases and conditions, such as:  Lung cancer or lung disease, such as COPD.  Heart disease.  Stroke.  Heart attack.  Infertility.  Osteoporosis and bone fractures. Additionally, symptoms such as coughing, wheezing, and shortness of breath may get better when you quit. You may also find that you get sick less often because your body is stronger at fighting off colds and infections. If you are pregnant, quitting smoking can help to reduce your chances of having a baby of low birth weight. HOW DO I GET READY TO QUIT? When you decide to quit smoking, create a plan to  make sure that you are successful. Before you quit:  Pick a date to quit. Set a date within the next two weeks to give you time to prepare.  Write down the reasons why you are quitting. Keep this list in places where you will see it often, such as on your bathroom mirror or in your car or wallet.  Identify the people, places, things, and activities that make you want to smoke (triggers) and avoid them. Make sure to take these actions:  Throw away all cigarettes at home, at work, and in your car.  Throw away smoking accessories, such as ashtrays and lighters.  Clean your car and make sure to empty the ashtray.  Clean your home, including curtains and carpets.  Tell your family, friends, and coworkers that you are quitting. Support from your loved ones can make quitting easier.  Talk with your health care provider about your options for quitting smoking.  Find out what treatment options are covered by your health insurance. WHAT STRATEGIES CAN I USE TO QUIT SMOKING?  Talk with your healthcare provider about different strategies to quit smoking. Some strategies include:  Quitting smoking altogether instead of gradually lessening how much you smoke over a period of time. Research shows that quitting "cold turkey" is more successful than gradually quitting.  Attending in-person counseling to help you build problem-solving skills. You are more likely to have success in quitting if you attend several counseling sessions. Even short sessions of 10 minutes can be effective.  Finding resources and support systems that can help you to quit smoking and remain smoke-free after you quit. These resources are most helpful when you use them often. They can include:  Online chats with a counselor.  Telephone quitlines.  Printed self-help materials.  Support groups or group counseling.  Text messaging programs.  Mobile phone applications.  Taking medicines to help you quit smoking. (If you are  pregnant or breastfeeding, talk with your health care provider first.) Some medicines contain nicotine and some do not. Both types of medicines help with cravings, but the medicines that include nicotine help to relieve withdrawal symptoms. Your health care provider may recommend:  Nicotine patches, gum, or lozenges.  Nicotine inhalers or sprays.  Non-nicotine medicine that is taken by mouth. Talk with your health care provider about combining strategies, such as taking medicines while you are also receiving in-person counseling. Using these two strategies together makes you more likely to succeed in quitting than if you used either strategy on its own. If you are pregnant or breastfeeding, talk with your health care provider about finding counseling or other support strategies to quit smoking. Do not take medicine to help you   quit smoking unless told to do so by your health care provider. WHAT THINGS CAN I DO TO MAKE IT EASIER TO QUIT? Quitting smoking might feel overwhelming at first, but there is a lot that you can do to make it easier. Take these important actions:  Reach out to your family and friends and ask that they support and encourage you during this time. Call telephone quitlines, reach out to support groups, or work with a counselor for support.  Ask people who smoke to avoid smoking around you.  Avoid places that trigger you to smoke, such as bars, parties, or smoke-break areas at work.  Spend time around people who do not smoke.  Lessen stress in your life, because stress can be a smoking trigger for some people. To lessen stress, try:  Exercising regularly.  Deep-breathing exercises.  Yoga.  Meditating.  Performing a body scan. This involves closing your eyes, scanning your body from head to toe, and noticing which parts of your body are particularly tense. Purposefully relax the muscles in those areas.  Download or purchase mobile phone or tablet apps (applications)  that can help you stick to your quit plan by providing reminders, tips, and encouragement. There are many free apps, such as QuitGuide from the CDC (Centers for Disease Control and Prevention). You can find other support for quitting smoking (smoking cessation) through smokefree.gov and other websites. HOW WILL I FEEL WHEN I QUIT SMOKING? Within the first 24 hours of quitting smoking, you may start to feel some withdrawal symptoms. These symptoms are usually most noticeable 2-3 days after quitting, but they usually do not last beyond 2-3 weeks. Changes or symptoms that you might experience include:  Mood swings.  Restlessness, anxiety, or irritation.  Difficulty concentrating.  Dizziness.  Strong cravings for sugary foods in addition to nicotine.  Mild weight gain.  Constipation.  Nausea.  Coughing or a sore throat.  Changes in how your medicines work in your body.  A depressed mood.  Difficulty sleeping (insomnia). After the first 2-3 weeks of quitting, you may start to notice more positive results, such as:  Improved sense of smell and taste.  Decreased coughing and sore throat.  Slower heart rate.  Lower blood pressure.  Clearer skin.  The ability to breathe more easily.  Fewer sick days. Quitting smoking is very challenging for most people. Do not get discouraged if you are not successful the first time. Some people need to make many attempts to quit before they achieve long-term success. Do your best to stick to your quit plan, and talk with your health care provider if you have any questions or concerns.   This information is not intended to replace advice given to you by your health care provider. Make sure you discuss any questions you have with your health care provider.   Document Released: 09/17/2001 Document Revised: 02/07/2015 Document Reviewed: 02/07/2015 Elsevier Interactive Patient Education 2016 [employer] Inc[employer]  

## 2016-06-19 ENCOUNTER — Encounter: Payer: Medicare Other | Admitting: Vascular Surgery

## 2016-06-26 ENCOUNTER — Encounter: Payer: Self-pay | Admitting: Orthopaedic Surgery

## 2016-06-26 ENCOUNTER — Ambulatory Visit (INDEPENDENT_AMBULATORY_CARE_PROVIDER_SITE_OTHER): Payer: Medicare Other | Admitting: Orthopaedic Surgery

## 2016-06-26 VITALS — BP 119/63 | HR 62 | Temp 97.9°F | Ht 71.0 in | Wt 158.0 lb

## 2016-06-26 DIAGNOSIS — M5442 Lumbago with sciatica, left side: Secondary | ICD-10-CM | POA: Diagnosis not present

## 2016-06-26 DIAGNOSIS — F17210 Nicotine dependence, cigarettes, uncomplicated: Secondary | ICD-10-CM | POA: Diagnosis not present

## 2016-06-26 MED ORDER — HYDROCODONE-ACETAMINOPHEN 5-325 MG PO TABS: 1.0000 | ORAL_TABLET | Freq: Four times a day (QID) | ORAL | 0 refills | Status: DC | PRN
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
## 2016-06-26 NOTE — Patient Instructions (Signed)
Smoking Cessation, Tips for Success If you are ready to quit smoking, congratulations! You have chosen to help yourself be healthier. Cigarettes bring nicotine, tar, carbon monoxide, and other irritants into your body. Your lungs, heart, and blood vessels will be able to work better without these poisons. There are many different ways to quit smoking. Nicotine gum, nicotine patches, a nicotine inhaler, or nicotine nasal spray can help with physical craving. Hypnosis, support groups, and medicines help break the habit of smoking. WHAT THINGS CAN I DO TO MAKE QUITTING EASIER?  Here are some tips to help you quit for good:  Pick a date when you will quit smoking completely. Tell all of your friends and family about your plan to quit on that date.  Do not try to slowly cut down on the number of cigarettes you are smoking. Pick a quit date and quit smoking completely starting on that day.  Throw away all cigarettes.   Clean and remove all ashtrays from your home, work, and car.  On a card, write down your reasons for quitting. Carry the card with you and read it when you get the urge to smoke.  Cleanse your body of nicotine. Drink enough water and fluids to keep your urine clear or pale yellow. Do this after quitting to flush the nicotine from your body.  Learn to predict your moods. Do not let a bad situation be your excuse to have a cigarette. Some situations in your life might tempt you into wanting a cigarette.  Never have "just one" cigarette. It leads to wanting another and another. Remind yourself of your decision to quit.  Change habits associated with smoking. If you smoked while driving or when feeling stressed, try other activities to replace smoking. Stand up when drinking your coffee. Brush your teeth after eating. Sit in a different chair when you read the paper. Avoid alcohol while trying to quit, and try to drink fewer caffeinated beverages. Alcohol and caffeine may urge you to  smoke.  Avoid foods and drinks that can trigger a desire to smoke, such as sugary or spicy foods and alcohol.  Ask people who smoke not to smoke around you.  Have something planned to do right after eating or having a cup of coffee. For example, plan to take a walk or exercise.  Try a relaxation exercise to calm you down and decrease your stress. Remember, you may be tense and nervous for the first 2 weeks after you quit, but this will pass.  Find new activities to keep your hands busy. Play with a pen, coin, or rubber band. Doodle or draw things on paper.  Brush your teeth right after eating. This will help cut down on the craving for the taste of tobacco after meals. You can also try mouthwash.   Use oral substitutes in place of cigarettes. Try using lemon drops, carrots, cinnamon sticks, or chewing gum. Keep them handy so they are available when you have the urge to smoke.  When you have the urge to smoke, try deep breathing.  Designate your home as a nonsmoking area.  If you are a heavy smoker, ask your health care provider about a prescription for nicotine chewing gum. It can ease your withdrawal from nicotine.  Reward yourself. Set aside the cigarette money you save and buy yourself something nice.  Look for support from others. Join a support group or smoking cessation program. Ask someone at home or at work to help you with your plan   to quit smoking.  Always ask yourself, "Do I need this cigarette or is this just a reflex?" Tell yourself, "Today, I choose not to smoke," or "I do not want to smoke." You are reminding yourself of your decision to quit.  Do not replace cigarette smoking with electronic cigarettes (commonly called e-cigarettes). The safety of e-cigarettes is unknown, and some may contain harmful chemicals.  If you relapse, do not give up! Plan ahead and think about what you will do the next time you get the urge to smoke. HOW WILL I FEEL WHEN I QUIT SMOKING? You  may have symptoms of withdrawal because your body is used to nicotine (the addictive substance in cigarettes). You may crave cigarettes, be irritable, feel very hungry, cough often, get headaches, or have difficulty concentrating. The withdrawal symptoms are only temporary. They are strongest when you first quit but will go away within 10-14 days. When withdrawal symptoms occur, stay in control. Think about your reasons for quitting. Remind yourself that these are signs that your body is healing and getting used to being without cigarettes. Remember that withdrawal symptoms are easier to treat than the major diseases that smoking can cause.  Even after the withdrawal is over, expect periodic urges to smoke. However, these cravings are generally short lived and will go away whether you smoke or not. Do not smoke! WHAT RESOURCES ARE AVAILABLE TO HELP ME QUIT SMOKING? Your health care provider can direct you to community resources or hospitals for support, which may include:  Group support.  Education.  Hypnosis.  Therapy.   This information is not intended to replace advice given to you by your health care provider. Make sure you discuss any questions you have with your health care provider.   Document Released: 06/21/2004 Document Revised: 10/14/2014 Document Reviewed: 03/11/2013 Elsevier Interactive Patient Education 2016 Elsevier Inc.  

## 2016-07-08 DIAGNOSIS — Z23 Encounter for immunization: Secondary | ICD-10-CM | POA: Diagnosis not present

## 2016-07-29 ENCOUNTER — Telehealth: Payer: Self-pay | Admitting: Orthopaedic Surgery

## 2016-07-29 MED ORDER — HYDROCODONE-ACETAMINOPHEN 5-325 MG PO TABS: 1.0000 | ORAL_TABLET | Freq: Four times a day (QID) | ORAL | 0 refills | Status: DC | PRN

## 2016-07-29 NOTE — Telephone Encounter (Signed)
Patient called for refill:  HYDROcodone-acetaminophen (NORCO/VICODIN) 5-325 MG tablet 100 tablet   - Insurance shows as Medicare, primrary,  Medicaid, secondary.

## 2016-08-27 ENCOUNTER — Telehealth: Payer: Self-pay | Admitting: Orthopaedic Surgery

## 2016-08-27 MED ORDER — HYDROCODONE-ACETAMINOPHEN 5-325 MG PO TABS: 1.0000 | ORAL_TABLET | Freq: Four times a day (QID) | ORAL | 0 refills | Status: DC | PRN

## 2016-08-27 NOTE — Telephone Encounter (Signed)
Patient requests refill:  HYDROcodone-acetaminophen (NORCO/VICODIN) 5-325 MG tablet 90 tablet

## 2016-09-10 ENCOUNTER — Encounter (HOSPITAL_COMMUNITY): Payer: Self-pay | Admitting: Emergency Medicine

## 2016-09-10 ENCOUNTER — Emergency Department (HOSPITAL_COMMUNITY)
Admission: EM | Admit: 2016-09-10 | Discharge: 2016-09-10 | Disposition: A | Discharge status: Home / Self Care | Payer: Medicare Other | Attending: Emergency Medicine | Admitting: Emergency Medicine

## 2016-09-10 ENCOUNTER — Inpatient Hospital Stay (HOSPITAL_COMMUNITY)
Admission: AD | Admit: 2016-09-10 | Discharge: 2016-09-16 | DRG: 897 | Disposition: A | Discharge status: Home / Self Care | Payer: Medicare Other | Source: Intra-hospital | Attending: Psychiatry | Admitting: Psychiatry

## 2016-09-10 DIAGNOSIS — F17210 Nicotine dependence, cigarettes, uncomplicated: Secondary | ICD-10-CM | POA: Diagnosis not present

## 2016-09-10 DIAGNOSIS — Z23 Encounter for immunization: Secondary | ICD-10-CM | POA: Diagnosis not present

## 2016-09-10 DIAGNOSIS — Z7982 Long term (current) use of aspirin: Secondary | ICD-10-CM | POA: Diagnosis not present

## 2016-09-10 DIAGNOSIS — F329 Major depressive disorder, single episode, unspecified: Secondary | ICD-10-CM | POA: Diagnosis present

## 2016-09-10 DIAGNOSIS — Z89022 Acquired absence of left finger(s): Secondary | ICD-10-CM

## 2016-09-10 DIAGNOSIS — R45851 Suicidal ideations: Secondary | ICD-10-CM | POA: Diagnosis not present

## 2016-09-10 DIAGNOSIS — F1420 Cocaine dependence, uncomplicated: Secondary | ICD-10-CM | POA: Diagnosis not present

## 2016-09-10 DIAGNOSIS — I739 Peripheral vascular disease, unspecified: Secondary | ICD-10-CM | POA: Diagnosis present

## 2016-09-10 DIAGNOSIS — Z79899 Other long term (current) drug therapy: Secondary | ICD-10-CM

## 2016-09-10 DIAGNOSIS — F1494 Cocaine use, unspecified with cocaine-induced mood disorder: Secondary | ICD-10-CM | POA: Diagnosis present

## 2016-09-10 DIAGNOSIS — F1994 Other psychoactive substance use, unspecified with psychoactive substance-induced mood disorder: Secondary | ICD-10-CM | POA: Diagnosis present

## 2016-09-10 DIAGNOSIS — F1424 Cocaine dependence with cocaine-induced mood disorder: Secondary | ICD-10-CM | POA: Diagnosis present

## 2016-09-10 LAB — URINALYSIS, ROUTINE W REFLEX MICROSCOPIC
BILIRUBIN URINE: NEGATIVE
GLUCOSE, UA: NEGATIVE mg/dL
HGB URINE DIPSTICK: NEGATIVE
KETONES UR: NEGATIVE mg/dL
LEUKOCYTES UA: NEGATIVE
Nitrite: NEGATIVE
PH: 6 (ref 5.0–8.0)
PROTEIN: NEGATIVE mg/dL
Specific Gravity, Urine: 1.006 (ref 1.005–1.030)

## 2016-09-10 LAB — CBC
HEMATOCRIT: 43.5 % (ref 39.0–52.0)
HEMOGLOBIN: 14.2 g/dL (ref 13.0–17.0)
MCH: 28.6 pg (ref 26.0–34.0)
MCHC: 32.6 g/dL (ref 30.0–36.0)
MCV: 87.7 fL (ref 78.0–100.0)
Platelets: 234 10*3/uL (ref 150–400)
RBC: 4.96 MIL/uL (ref 4.22–5.81)
RDW: 13.7 % (ref 11.5–15.5)
WBC: 9.6 10*3/uL (ref 4.0–10.5)

## 2016-09-10 LAB — COMPREHENSIVE METABOLIC PANEL
ALT: 18 U/L (ref 17–63)
AST: 20 U/L (ref 15–41)
Albumin: 4.2 g/dL (ref 3.5–5.0)
Alkaline Phosphatase: 50 U/L (ref 38–126)
Anion gap: 8 (ref 5–15)
BUN: 9 mg/dL (ref 6–20)
CHLORIDE: 99 mmol/L — AB (ref 101–111)
CO2: 28 mmol/L (ref 22–32)
CREATININE: 0.89 mg/dL (ref 0.61–1.24)
Calcium: 9.5 mg/dL (ref 8.9–10.3)
GFR calc non Af Amer: >60 mL/min (ref >60)
Glucose, Bld: 98 mg/dL (ref 65–99)
POTASSIUM: 4.1 mmol/L (ref 3.5–5.1)
SODIUM: 135 mmol/L (ref 135–145)
Total Bilirubin: 0.5 mg/dL (ref 0.3–1.2)
Total Protein: 8.3 g/dL — ABNORMAL HIGH (ref 6.5–8.1)

## 2016-09-10 LAB — ETHANOL: Alcohol, Ethyl (B): <5 mg/dL (ref <5)

## 2016-09-10 LAB — RAPID URINE DRUG SCREEN, HOSP PERFORMED
AMPHETAMINES: NOT DETECTED
BARBITURATES: NOT DETECTED
BENZODIAZEPINES: NOT DETECTED
COCAINE: POSITIVE — AB
Opiates: NOT DETECTED
TETRAHYDROCANNABINOL: NOT DETECTED

## 2016-09-10 LAB — SALICYLATE LEVEL

## 2016-09-10 LAB — ACETAMINOPHEN LEVEL: Acetaminophen (Tylenol), Serum: <10 ug/mL — ABNORMAL LOW (ref 10–30)

## 2016-09-10 MED ORDER — ASPIRIN 81 MG PO CHEW
81.0000 mg | CHEWABLE_TABLET | Freq: Every day | ORAL | Status: DC
  Administered 2016-09-11 – 2016-09-16 (×6): 81 mg via ORAL
  Filled 2016-09-10 (×9): qty 1

## 2016-09-10 MED ORDER — QUETIAPINE FUMARATE 50 MG PO TABS
50.0000 mg | ORAL_TABLET | Freq: Every evening | ORAL | Status: DC | PRN
  Administered 2016-09-10: 50 mg via ORAL
  Filled 2016-09-10 (×4): qty 1

## 2016-09-10 MED ORDER — MAGNESIUM HYDROXIDE 400 MG/5ML PO SUSP: 30.0000 mL | Freq: Every day | ORAL | Status: DC | PRN

## 2016-09-10 MED ORDER — HYDROCODONE-ACETAMINOPHEN 5-325 MG PO TABS: 1.0000 | ORAL_TABLET | Freq: Four times a day (QID) | ORAL | Status: DC | PRN

## 2016-09-10 MED ORDER — ATORVASTATIN CALCIUM 10 MG PO TABS
10.0000 mg | ORAL_TABLET | Freq: Every day | ORAL | Status: DC
  Filled 2016-09-10: qty 1

## 2016-09-10 MED ORDER — ACETAMINOPHEN 325 MG PO TABS
650.0000 mg | ORAL_TABLET | Freq: Four times a day (QID) | ORAL | Status: DC | PRN
  Administered 2016-09-14: 650 mg via ORAL
  Filled 2016-09-10 (×2): qty 2

## 2016-09-10 MED ORDER — ALUM & MAG HYDROXIDE-SIMETH 200-200-20 MG/5ML PO SUSP
30.0000 mL | ORAL | Status: DC | PRN
  Administered 2016-09-11 – 2016-09-14 (×5): 30 mL via ORAL
  Filled 2016-09-10 (×5): qty 30

## 2016-09-10 MED ORDER — NICOTINE 21 MG/24HR TD PT24
21.0000 mg | MEDICATED_PATCH | Freq: Every day | TRANSDERMAL | Status: DC
  Administered 2016-09-10: 21 mg via TRANSDERMAL
  Filled 2016-09-10: qty 1

## 2016-09-10 MED ORDER — DULOXETINE HCL 20 MG PO CPEP
20.0000 mg | ORAL_CAPSULE | Freq: Two times a day (BID) | ORAL | Status: DC
  Administered 2016-09-10 – 2016-09-11 (×2): 20 mg via ORAL
  Filled 2016-09-10 (×6): qty 1

## 2016-09-10 MED ORDER — ASPIRIN 81 MG PO CHEW: 81.0000 mg | CHEWABLE_TABLET | Freq: Every day | ORAL | Status: DC

## 2016-09-10 MED ORDER — HYDROXYZINE HCL 25 MG PO TABS
25.0000 mg | ORAL_TABLET | Freq: Four times a day (QID) | ORAL | Status: DC | PRN
  Administered 2016-09-14 (×2): 25 mg via ORAL
  Filled 2016-09-10 (×2): qty 1

## 2016-09-10 MED ORDER — KETOROLAC TROMETHAMINE 30 MG/ML IJ SOLN
60.0000 mg | Freq: Once | INTRAMUSCULAR | Status: AC
  Administered 2016-09-10: 60 mg via INTRAMUSCULAR
  Filled 2016-09-10: qty 2

## 2016-09-10 MED ORDER — ATORVASTATIN CALCIUM 10 MG PO TABS
10.0000 mg | ORAL_TABLET | Freq: Every day | ORAL | Status: DC
  Administered 2016-09-11 – 2016-09-15 (×5): 10 mg via ORAL
  Filled 2016-09-10 (×8): qty 1
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
## 2016-09-10 NOTE — Progress Notes (Signed)
Patient ID: Gary [name], male   DOB: 1955-12-31, 60 y.o.   MRN: [id number] Per State regulations 482.30 this chart was reviewed for medical necessity with respect to the patient's admission/duration of stay.    Next review date: 09/13/16  Debarah Crape, BSN, RN-BC  Case Manager

## 2016-09-10 NOTE — ED Triage Notes (Signed)
PT was sent from [facility] for eval due SI. PT states SI for over a month with no plan but states history of suicidal attempt. PT states he has been hearing voices as well but can't understand what they are saying to him and denies any visual hallucinations.

## 2016-09-10 NOTE — ED Notes (Signed)
Pt request nicotine patch. Notified ED Tech

## 2016-09-10 NOTE — ED Notes (Signed)
Pt has been accepted to [facility] room 302-1, pt and  Dr Lita Mains notified,

## 2016-09-10 NOTE — ED Notes (Signed)
Attempted to call report, RN will return call to er,

## 2016-09-10 NOTE — Progress Notes (Signed)
Per Mickel Baas, NP meets inpatient criteria for psych. Care.  Per [facility] patient has been accepted at the unit and is assigned 305-1. Altheia Shafran K. Nash Shearer, LPC-A, [facility]  Counselor 09/10/2016 6:09 PM

## 2016-09-10 NOTE — ED Notes (Signed)
Called [facility] to give report on pt, spoke with staff at [facility] who advised that Gary [name] was doing pt's admission and that they did not need any further information,
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
## 2016-09-10 NOTE — ED Notes (Signed)
Received report on pt, pt lying in bed, requesting nicotine patch, patch applied per prn order, pt admits to having SI thoughts today, comfort measures provided, sitter remains at bedside,

## 2016-09-10 NOTE — ED Notes (Signed)
Telepsych assessment completed at this time.

## 2016-09-10 NOTE — ED Notes (Signed)
[facility] here to transport pt,

## 2016-09-11 ENCOUNTER — Encounter (HOSPITAL_COMMUNITY): Payer: Self-pay | Admitting: *Deleted

## 2016-09-11 ENCOUNTER — Encounter: Payer: Self-pay | Admitting: Family

## 2016-09-11 DIAGNOSIS — F1420 Cocaine dependence, uncomplicated: Secondary | ICD-10-CM | POA: Diagnosis present

## 2016-09-11 DIAGNOSIS — R45851 Suicidal ideations: Secondary | ICD-10-CM

## 2016-09-11 DIAGNOSIS — Z79899 Other long term (current) drug therapy: Secondary | ICD-10-CM

## 2016-09-11 DIAGNOSIS — F1994 Other psychoactive substance use, unspecified with psychoactive substance-induced mood disorder: Secondary | ICD-10-CM

## 2016-09-11 HISTORY — DX: Cocaine dependence, uncomplicated: F14.20

## 2016-09-11 LAB — LIPID PANEL
CHOL/HDL RATIO: 4.6 ratio
CHOLESTEROL: 188 mg/dL (ref 0–200)
HDL: 41 mg/dL (ref >40)
LDL Cholesterol: 116 mg/dL — ABNORMAL HIGH (ref 0–99)
TRIGLYCERIDES: 153 mg/dL — AB (ref <150)
VLDL: 31 mg/dL (ref 0–40)

## 2016-09-11 LAB — TSH: TSH: 2.158 u[IU]/mL (ref 0.350–4.500)

## 2016-09-11 MED ORDER — DULOXETINE HCL 20 MG PO CPEP
20.0000 mg | ORAL_CAPSULE | Freq: Every day | ORAL | Status: DC
  Administered 2016-09-12 – 2016-09-14 (×3): 20 mg via ORAL
  Filled 2016-09-11 (×5): qty 1

## 2016-09-11 MED ORDER — TRAZODONE HCL 50 MG PO TABS
50.0000 mg | ORAL_TABLET | Freq: Every evening | ORAL | Status: DC | PRN
  Administered 2016-09-11 – 2016-09-15 (×4): 50 mg via ORAL
  Filled 2016-09-11 (×4): qty 1

## 2016-09-11 MED ORDER — GABAPENTIN 600 MG PO TABS
300.0000 mg | ORAL_TABLET | Freq: Three times a day (TID) | ORAL | Status: DC
  Filled 2016-09-11 (×3): qty 0.5

## 2016-09-11 MED ORDER — IBUPROFEN 600 MG PO TABS
600.0000 mg | ORAL_TABLET | Freq: Four times a day (QID) | ORAL | Status: DC | PRN
  Administered 2016-09-11 – 2016-09-13 (×2): 600 mg via ORAL
  Filled 2016-09-11 (×2): qty 1

## 2016-09-11 MED ORDER — PNEUMOCOCCAL VAC POLYVALENT 25 MCG/0.5ML IJ INJ
0.5000 mL | INJECTION | INTRAMUSCULAR | Status: AC
  Administered 2016-09-12: 0.5 mL via INTRAMUSCULAR

## 2016-09-11 MED ORDER — CYCLOBENZAPRINE HCL 10 MG PO TABS
10.0000 mg | ORAL_TABLET | Freq: Three times a day (TID) | ORAL | Status: DC | PRN
  Administered 2016-09-12 – 2016-09-14 (×3): 10 mg via ORAL
  Filled 2016-09-11 (×4): qty 1

## 2016-09-11 MED ORDER — NICOTINE 21 MG/24HR TD PT24
21.0000 mg | MEDICATED_PATCH | Freq: Every day | TRANSDERMAL | Status: DC
  Administered 2016-09-11 – 2016-09-14 (×4): 21 mg via TRANSDERMAL
  Filled 2016-09-11 (×9): qty 1

## 2016-09-11 MED ORDER — GABAPENTIN 300 MG PO CAPS
300.0000 mg | ORAL_CAPSULE | Freq: Three times a day (TID) | ORAL | Status: DC
  Administered 2016-09-11 – 2016-09-13 (×6): 300 mg via ORAL
  Filled 2016-09-11 (×9): qty 1
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
## 2016-09-11 NOTE — BHH Group Notes (Signed)
[place] LCSW Group Therapy  09/11/2016 3:21 PM  Type of Therapy:  Group Therapy  Participation Level:  Did Not Attend-pt invited. Chose to remain in bed.   Summary of Progress/Problems: Today's Topic: Overcoming Obstacles. Patients identified one short term goal and potential obstacles in reaching this goal. Patients processed barriers involved in overcoming these obstacles. Patients identified steps necessary for overcoming these obstacles and explored motivation (internal and external) for facing these difficulties head on.   Gary [name] N Smart LCSW 09/11/2016, 3:21 PM

## 2016-09-11 NOTE — Progress Notes (Signed)
Pt did not attend the NA meeting this evening.

## 2016-09-11 NOTE — Progress Notes (Signed)
60 year old male pt admitted on voluntary basis. Kiam reports that he has been feeling depressed and suicidal and reports that he abuses crack cocaine on a daily basis. Dominik reports that he is currently not on any medications and reports that he would like to be on some if they can help with his depression. Bradyn also reports his biggest problem as being addicted to crack cocaine and reports daily use with it.  Zyere did endorse some passive SI but is able to contract for safety in the milieu. Darius reports he lives alone and is able to go back to his same living situation after discharge. Dreyson reports not eating or sleeping well. Artemas did appear depressed throughout admission process. Keyontae was oriented to the unit and safety maintained.

## 2016-09-11 NOTE — Progress Notes (Signed)
Recreation Therapy Notes  Date: 09/11/16 Time: 0930 Location: [address] Dayroom  Group Topic: Stress Management  Goal Area(s) Addresses:  Patient will verbalize importance of using healthy stress management.  Patient will identify positive emotions associated with healthy stress management.   Intervention: Stress Management  Activity :  Guided Imagery.  LRT introduced the stress management technique of guided imagery to the patients.  LRT read a script to allow patients to envision their peaceful place.  Patients were to follow allow with the script to engage in the activity.    Education:  Stress Management, Discharge Planning.   Education Outcome: Acknowledges edcuation/In group clarification offered/Needs additional education  Clinical Observations/Feedback: Pt did not attend group.   Victorino Sparrow, LRT/CTRS         Victorino Sparrow A 09/11/2016 12:36 PM
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
## 2016-09-11 NOTE — Tx Team (Signed)
Interdisciplinary Treatment and Diagnostic Plan Update  09/11/2016 Time of Session: 9:30AM Gary [name] MRN: [id number]  Principal Diagnosis: Cocaine use disorder, severe, dependence ([place])  Secondary Diagnoses: Principal Problem:   Cocaine use disorder, severe, dependence ([place]) Active Problems:   Substance induced mood disorder ([facility])   Current Medications:  Current Facility-Administered Medications  Medication Dose Route Frequency Provider Last Rate Last Dose  . acetaminophen (TYLENOL) tablet 650 mg  650 mg Oral Q6H PRN Laverle Hobby, PA-C      . alum & mag hydroxide-simeth (MAALOX/MYLANTA) 200-200-20 MG/5ML suspension 30 mL  30 mL Oral Q4H PRN Laverle Hobby, PA-C      . aspirin chewable tablet 81 mg  81 mg Oral Daily Laverle Hobby, PA-C   81 mg at 09/11/16 0816  . atorvastatin (LIPITOR) tablet 10 mg  10 mg Oral q1800 Laverle Hobby, PA-C      . cyclobenzaprine (FLEXERIL) tablet 10 mg  10 mg Oral TID PRN Linard Millers, MD      . Derrill Memo ON 09/12/2016] DULoxetine (CYMBALTA) DR capsule 20 mg  20 mg Oral Daily Linard Millers, MD      . gabapentin (NEURONTIN) capsule 300 mg  300 mg Oral TID Linard Millers, MD   300 mg at 09/11/16 1204  . hydrOXYzine (ATARAX/VISTARIL) tablet 25 mg  25 mg Oral Q6H PRN Laverle Hobby, PA-C      . ibuprofen (ADVIL,MOTRIN) tablet 600 mg  600 mg Oral Q6H PRN Linard Millers, MD      . magnesium hydroxide (MILK OF MAGNESIA) suspension 30 mL  30 mL Oral Daily PRN Laverle Hobby, PA-C      . nicotine (NICODERM CQ - dosed in mg/24 hours) patch 21 mg  21 mg Transdermal Daily Laverle Hobby, PA-C   21 mg at 09/11/16 0816  . [START ON 09/12/2016] pneumococcal 23 valent vaccine (PNU-IMMUNE) injection 0.5 mL  0.5 mL Intramuscular Tomorrow-1000 Spencer E Simon, PA-C      . traZODone (DESYREL) tablet 50 mg  50 mg Oral QHS PRN Linard Millers, MD       PTA Medications: Facility-Administered Medications Prior to Admission   Medication Dose Route Frequency Provider Last Rate Last Dose  . atorvastatin (LIPITOR) tablet 10 mg  10 mg Oral q1800 Alvia Grove, PA-C       Prescriptions Prior to Admission  Medication Sig Dispense Refill Last Dose  . aspirin 81 MG tablet Take 81 mg by mouth daily.   09/10/2016 at Unknown time  . HYDROcodone-acetaminophen (NORCO/VICODIN) 5-325 MG tablet Take 1 tablet by mouth every 6 (six) hours as needed for moderate pain (Must last 30 days.Do not take and drive a car or use machinery.). 90 tablet 0 09/10/2016 at Unknown time    Patient Stressors: Medication change or noncompliance Substance abuse  Patient Strengths: Ability for insight Average or above average intelligence Capable of independent living [employer] of knowledge Motivation for treatment/growth  Treatment Modalities: Medication Management, Group therapy, Case management,  1 to 1 session with clinician, Psychoeducation, Recreational therapy.   Physician Treatment Plan for Primary Diagnosis: Cocaine use disorder, severe, dependence ([place]) Long Term Goal(s): Improvement in symptoms so as ready for discharge Improvement in symptoms so as ready for discharge   Short Term Goals: Ability to verbalize feelings will improve Ability to disclose and discuss suicidal ideas Ability to demonstrate self-control will improve Ability to identify changes in lifestyle to reduce recurrence of condition will improve  Ability to identify triggers associated with substance abuse/mental health issues will improve  Medication Management: Evaluate patient's response, side effects, and tolerance of medication regimen.  Therapeutic Interventions: 1 to 1 sessions, Unit Group sessions and Medication administration.  Evaluation of Outcomes: Progressing  Physician Treatment Plan for Secondary Diagnosis: Principal Problem:   Cocaine use disorder, severe, dependence ([place]) Active Problems:   Substance induced mood disorder ([place])  Long  Term Goal(s): Improvement in symptoms so as ready for discharge Improvement in symptoms so as ready for discharge   Short Term Goals: Ability to verbalize feelings will improve Ability to disclose and discuss suicidal ideas Ability to demonstrate self-control will improve Ability to identify changes in lifestyle to reduce recurrence of condition will improve Ability to identify triggers associated with substance abuse/mental health issues will improve     Medication Management: Evaluate patient's response, side effects, and tolerance of medication regimen.  Therapeutic Interventions: 1 to 1 sessions, Unit Group sessions and Medication administration.  Evaluation of Outcomes: Progressing   RN Treatment Plan for Primary Diagnosis: Cocaine use disorder, severe, dependence ([place]) Long Term Goal(s): Knowledge of disease and therapeutic regimen to maintain health will improve  Short Term Goals: Ability to remain free from injury will improve, Ability to disclose and discuss suicidal ideas and Ability to identify and develop effective coping behaviors will improve  Medication Management: RN will administer medications as ordered by provider, will assess and evaluate patient's response and provide education to patient for prescribed medication. RN will report any adverse and/or side effects to prescribing provider.  Therapeutic Interventions: 1 on 1 counseling sessions, Psychoeducation, Medication administration, Evaluate responses to treatment, Monitor vital signs and CBGs as ordered, Perform/monitor CIWA, COWS, AIMS and Fall Risk screenings as ordered, Perform wound care treatments as ordered.  Evaluation of Outcomes: Progressing   LCSW Treatment Plan for Primary Diagnosis: Cocaine use disorder, severe, dependence ([place]) Long Term Goal(s): Safe transition to appropriate next level of care at discharge, Engage patient in therapeutic group addressing interpersonal concerns.  Short Term Goals:  Engage patient in aftercare planning with referrals and resources, Increase social support, Facilitate patient progression through stages of change regarding substance use diagnoses and concerns, Identify triggers associated with mental health/substance abuse issues and Increase skills for wellness and recovery  Therapeutic Interventions: Assess for all discharge needs, 1 to 1 time with Social worker, Explore available resources and support systems, Assess for adequacy in community support network, Educate family and significant other(s) on suicide prevention, Complete Psychosocial Assessment, Interpersonal group therapy.  Evaluation of Outcomes: Progressing   Progress in Treatment: Attending groups: No. New to unit. Continuing to assess.  Participating in groups: No. Taking medication as prescribed: Yes. Toleration medication: Yes. Family/Significant other contact made: No, will contact:  family member if patient consents Patient understands diagnosis: Yes. Discussing patient identified problems/goals with staff: Yes. Medical problems stabilized or resolved: Yes. Denies suicidal/homicidal ideation: No. Passive SI/able to contract for safety on the unit.  Issues/concerns per patient self-inventory: No. Other: n/a  New problem(s) identified: No, Describe:  n/a  New Short Term/Long Term Goal(s): Medication management for mood stabilization, detox, and development of comprehensive mental wellness/sobriety plan.   Discharge Plan or Barriers: CSW assessing for appropriate referrals. Currently, pt lives alone in [facility] and goes to [facility] for mental health services.   Reason for Continuation of Hospitalization: Depression Medication stabilization Suicidal ideation Withdrawal symptoms  Estimated Length of Stay: 3-5 days   Attendees: Patient: 09/11/2016 3:19 PM  Physician: Dr.  Sharolyn Douglas MD 09/11/2016 3:19 PM  Nursing: Geri Seminole RN 09/11/2016 3:19 PM  RN Care Manager:  Lars Pinks CM 09/11/2016 3:19 PM  Social Worker:  Maxie Better, LCSW 09/11/2016 3:19 PM  Recreational Therapist:  09/11/2016 3:19 PM  Other: Samuel Jester NP; Lindell Spar NP 09/11/2016 3:19 PM  Other:  09/11/2016 3:19 PM  Other: 09/11/2016 3:19 PM    Scribe for Treatment Team: [place], LCSW 09/11/2016 3:19 PM

## 2016-09-11 NOTE — BHH Suicide Risk Assessment (Signed)
[facility] Admission Suicide Risk Assessment   Nursing information obtained from:    Demographic factors:    Current Mental Status:    Loss Factors:    Historical Factors:    Risk Reduction Factors:     Total Time spent with patient: 30 minutes Principal Problem: Cocaine use disorder, severe, dependence ([place]) Diagnosis:   Patient Active Problem List   Diagnosis Date Noted  . Cocaine use disorder, severe, dependence ([place]) [F14.20] 09/11/2016  . Substance induced mood disorder ([place]) [F19.94] 09/10/2016  . Aortoiliac occlusive disease ([place]) [I74.09] 05/10/2016  . PAD (peripheral artery disease) ([place]) [I73.9] 04/02/2016  . History of adenomatous polyp of colon [Z86.010] 04/03/2015  . Chronic diarrhea [K52.9] 11/03/2014  . History of colonic polyps [Z86.010] 11/03/2014  . GERD (gastroesophageal reflux disease) [K21.9] 12/06/2013  . Early satiety [R68.81] 12/06/2013  . Encounter for screening colonoscopy [Z12.11] 12/06/2013  . Depression, major [F32.9] 05/30/2011  . Open gastric injury [S36.30XA] 05/30/2011  . Injury of diaphragm with open wound into cavity [S27.809A, S21.309A] 05/30/2011   Subjective Data: Patient denies current active suicidal or homicidal ideation, plan or intent.  Continued Clinical Symptoms:  Alcohol Use Disorder Identification Test Final Score (AUDIT): 1 The "Alcohol Use Disorders Identification Test", Guidelines for Use in Primary Care, Second Edition.  World Pharmacologist [facility]). Score between 0-7:  no or low risk or alcohol related problems. Score between 8-15:  moderate risk of alcohol related problems. Score between 16-19:  high risk of alcohol related problems. Score 20 or above:  warrants further diagnostic evaluation for alcohol dependence and treatment.   CLINICAL FACTORS:   Depression:   Hopelessness Alcohol/Substance Abuse/Dependencies Chronic Pain Medical Diagnoses and Treatments/Surgeries   Musculoskeletal: Strength & Muscle Tone: within  normal limits Gait & Station: normal Patient leans: N/A  Psychiatric Specialty Exam: Physical Exam  ROS  Blood pressure (!) 94/54, pulse 78, temperature 98.4 F (36.9 C), temperature source Oral, resp. rate 16, height 5\' 9"  (1.753 m), weight 72.6 kg (160 lb).Body mass index is 23.63 kg/m.   General Appearance: Casual  Eye Contact:  Fair  Speech:  Clear and Coherent  Volume:  Normal  Mood:  Depressed  Affect:  Congruent  Thought Process:  Coherent  Orientation:  Negative  Thought Content:  Negative  Suicidal Thoughts:  Yes.  without intent/plan  Homicidal Thoughts:  No  Memory:  Negative  Judgement:  Fair  Insight:  Present  Psychomotor Activity:  Normal  Concentration:  Concentration: Good  Recall:  Good  Fund of Knowledge:  Good  Language:  Good  Akathisia:  No  Handed:  Right  AIMS (if indicated):     Assets:  Resilience  ADL's:  Intact  Cognition:  WNL  Sleep:  Number of Hours: 6     COGNITIVE FEATURES THAT CONTRIBUTE TO RISK:  None    SUICIDE RISK:   Severe:  Frequent, intense, and enduring suicidal ideation, specific plan, no subjective intent, but some objective markers of intent (i.e., choice of lethal method), the method is accessible, some limited preparatory behavior, evidence of impaired self-control, severe dysphoria/symptomatology, multiple risk factors present, and few if any protective factors, particularly a lack of social support.   PLAN OF CARE: see PAA  I certify that inpatient services furnished can reasonably be expected to improve the patient's condition.  Linard Millers, MD 09/11/2016, 11:44 AM

## 2016-09-11 NOTE — Progress Notes (Signed)
Report received from admitting RN.  Pt denies SI/HI, denies hallucinations, denies pain.  Medications administered per order.  Medication education provided.  Pt reports he will inform staff of needs and concerns.  He verbally contracts for safety.  Will continue to monitor and assess.

## 2016-09-11 NOTE — Tx Team (Signed)
Initial Treatment Plan 09/11/2016 1:57 AM [name] [id number]    PATIENT STRESSORS: Medication change or noncompliance Substance abuse   PATIENT STRENGTHS: Ability for insight Average or above average intelligence Capable of independent living General fund of knowledge Motivation for treatment/growth   PATIENT IDENTIFIED PROBLEMS: Depression Suicidal thoughts Substance abuse "Getting off crack cocaine"                     DISCHARGE CRITERIA:  Ability to meet basic life and health needs Improved stabilization in mood, thinking, and/or behavior Verbal commitment to aftercare and medication compliance  PRELIMINARY DISCHARGE PLAN: Attend aftercare/continuing care group Return to previous living arrangement  PATIENT/FAMILY INVOLVEMENT: This treatment plan has been presented to and reviewed with the patient, Gary [name], and/or family member, .  The patient and family have been given the opportunity to ask questions and make suggestions.  [place], [place], [place] 09/11/2016, 1:57 AM

## 2016-09-11 NOTE — Progress Notes (Signed)
Pt reported interest in [facility] house--no beds available for the "foreseable future" per Diane in admissions. [facility] referral faxed today 09/11/2016 10:48 AM   Maxie Better, MSW, LCSW Clinical Social Worker 09/11/2016 10:48 AM

## 2016-09-12 LAB — HEMOGLOBIN A1C
Hgb A1c MFr Bld: 5.3 % (ref 4.8–5.6)
Mean Plasma Glucose: 105 mg/dL

## 2016-09-12 LAB — PROLACTIN: Prolactin: 16.6 ng/mL — ABNORMAL HIGH (ref 4.0–15.2)

## 2016-09-12 NOTE — BHH Group Notes (Signed)
[place] LCSW Group Therapy  09/12/2016 3:35 PM  Type of Therapy:  Group Therapy  Participation Level:  Active  Participation Quality:  Attentive  Affect:  Appropriate  Cognitive:  Alert and Oriented  Insight:  Improving  Engagement in Therapy:  Engaged  Modes of Intervention:  Discussion, Education, Exploration, Problem-solving, Rapport Building, Socialization and Support  Summary of Progress/Problems: Emotion Regulation: This group focused on both positive and negative emotion identification and allowed group members to process ways to identify feelings, regulate negative emotions, and find healthy ways to manage internal/external emotions. Group members were asked to reflect on a time when their reaction to an emotion led to a negative outcome and explored how alternative responses using emotion regulation would have benefited them. Group members were also asked to discuss a time when emotion regulation was utilized when a negative emotion was experienced.   Hugh Garrow N Smart LCSW 09/12/2016, 3:35 PM

## 2016-09-12 NOTE — Progress Notes (Signed)
[facility] MD Progress Note  09/12/2016 11:50 AM  Patient Active Problem List   Diagnosis Date Noted  . Cocaine use disorder, severe, dependence ([place]) 09/11/2016  . Substance induced mood disorder ([place]) 09/10/2016  . Aortoiliac occlusive disease ([place]) 05/10/2016  . PAD (peripheral artery disease) ([place]) 04/02/2016  . History of adenomatous polyp of colon 04/03/2015  . Chronic diarrhea 11/03/2014  . History of colonic polyps 11/03/2014  . GERD (gastroesophageal reflux disease) 12/06/2013  . Early satiety 12/06/2013  . Encounter for screening colonoscopy 12/06/2013  . Depression, major 05/30/2011  . Open gastric injury 05/30/2011  . Injury of diaphragm with open wound into cavity 05/30/2011    Diagnosis:Cocaine use disorder, severe, depression  Subjective: Patient denies any side effects from starting his medications. He still reports feeling depressed and having passive suicidal ideation although he does not denies immediate plan to act on any suicidal thoughts. He denies any homicidal ideation, plan or intent.  Objective: Well-developed well-nourished man in no apparent distress speech and motor within normal limits set for mild dysarthria, secondary to dentition, mood is described as depressed and affect is congruent thought processes linear and goal-directed thought content does endorse ongoing passive suicidal ideation, alert and oriented, insight and judgment are fair IQ appears an average range     Current Facility-Administered Medications (Cardiovascular):  .  atorvastatin (LIPITOR) tablet 10 mg     Current Facility-Administered Medications (Analgesics):  .  acetaminophen (TYLENOL) tablet 650 mg .  aspirin chewable tablet 81 mg .  ibuprofen (ADVIL,MOTRIN) tablet 600 mg     Current Facility-Administered Medications (Other):  .  alum & mag hydroxide-simeth (MAALOX/MYLANTA) 200-200-20 MG/5ML suspension 30 mL .  cyclobenzaprine (FLEXERIL) tablet 10 mg .  DULoxetine (CYMBALTA)  DR capsule 20 mg .  gabapentin (NEURONTIN) capsule 300 mg .  hydrOXYzine (ATARAX/VISTARIL) tablet 25 mg .  magnesium hydroxide (MILK OF MAGNESIA) suspension 30 mL .  nicotine (NICODERM CQ - dosed in mg/24 hours) patch 21 mg .  traZODone (DESYREL) tablet 50 mg  No current outpatient prescriptions on file.  Vital Signs:Blood pressure 117/60, pulse 64, temperature 98.3 F (36.8 C), temperature source Oral, resp. rate (!) 24, height 5\' 9"  (1.753 m), weight 72.6 kg (160 lb).    Lab Results:  Results for orders placed or performed during the hospital encounter of 09/10/16 (from the past 48 hour(s))  TSH     Status: None   Collection Time: 09/11/16  6:06 AM  Result Value Ref Range   TSH 2.158 0.350 - 4.500 uIU/mL    Comment: Performed by a 3rd Generation assay with a functional sensitivity of <=0.01 uIU/mL. Performed at [facility]   Prolactin     Status: Abnormal   Collection Time: 09/11/16  6:06 AM  Result Value Ref Range   Prolactin 16.6 (H) 4.0 - 15.2 ng/mL    Comment: (NOTE) Performed At: [facility] [place], [place] [id number] Lindon Romp MD [id number] Performed at [employer] [facility]   Hemoglobin A1c     Status: None   Collection Time: 09/11/16  6:06 AM  Result Value Ref Range   Hgb A1c MFr Bld 5.3 4.8 - 5.6 %    Comment: (NOTE)         Pre-diabetes: 5.7 - 6.4         Diabetes: >6.4         Glycemic control for adults with diabetes: <7.0    Mean Plasma Glucose 105 mg/dL  Comment: (NOTE) Performed At: [facility] [place], [place] [id number] Lindon Romp MD [id number] Performed at [facility]   Lipid panel     Status: Abnormal   Collection Time: 09/11/16  6:06 AM  Result Value Ref Range   Cholesterol 188 0 - 200 mg/dL   Triglycerides 153 (H) <150 mg/dL   HDL 41 >40 mg/dL   Total CHOL/HDL Ratio 4.6 RATIO   VLDL 31 0 - 40 mg/dL   LDL Cholesterol 116  (H) 0 - 99 mg/dL    Comment:        Total Cholesterol/HDL:CHD Risk Coronary Heart Disease Risk Table                     Men   Women  1/2 Average Risk   3.4   3.3  Average Risk       5.0   4.4  2 X Average Risk   9.6   7.1  3 X Average Risk  23.4   11.0        Use the calculated Patient Ratio above and the CHD Risk Table to determine the patient's CHD Risk.        ATP III CLASSIFICATION (LDL):  <100     mg/dL   Optimal  100-129  mg/dL   Near or Above                    Optimal  130-159  mg/dL   Borderline  160-189  mg/dL   High  >190     mg/dL   Very High Performed at [facility]     Physical Findings: AIMS: Facial and Oral Movements Muscles of Facial Expression: None, normal Lips and Perioral Area: None, normal Jaw: None, normal Tongue: None, normal,Extremity Movements Upper (arms, wrists, hands, fingers): None, normal Lower (legs, knees, ankles, toes): None, normal, Trunk Movements Neck, shoulders, hips: None, normal, Overall Severity Severity of abnormal movements (highest score from questions above): None, normal Incapacitation due to abnormal movements: None, normal Patient's awareness of abnormal movements (rate only patient's report): No Awareness, Dental Status Current problems with teeth and/or dentures?: No Does patient usually wear dentures?: No  CIWA:    COWS:      Assessment/Plan: Patient is tolerating medications well and we discussed that it will take a few days to see them again to have an effect and he will need to continue them for another 2 weeks or so to really evaluate if they are helpful. Patient is interested in long-term programming for his substance use disorder and will be meeting with the social worker to explore options.  Linard Millers, MD 09/12/2016, 11:50 AM

## 2016-09-12 NOTE — Progress Notes (Signed)
Pt rates depression and anxiety as an 8 on 0-10 scale with 10 being the most. Pt reports passive si thoughts and he is working on coping skill to not use drugs such as identifying things to do to keep himself busy. Pt has a flat/sad affect with hopeless feelings. Pt says that he had his sons to remove all substances from his home to help him not relapse. A:Offered support, encouragement and 15 minute checks. R:Pt contracts with staff for safety. Safety maintained on the unit.

## 2016-09-12 NOTE — BHH Group Notes (Signed)
[place] Group Notes:  (Nursing/MHT/Case Management/Adjunct)  Date:  09/12/2016  Time:  0900  Type of Therapy:  Nurse Education  Participation Level:  Active  Participation Quality:  Appropriate  Affect:  Depressed and Flat  Cognitive:  Alert and Oriented  Insight:  Improving  Engagement in Group:  Developing/Improving  Modes of Intervention:  Discussion, Education, Socialization and Support  Summary of Progress/Problems:The purpose of this group is to assist patients to set a goal for today and introduce patients to the benefits of aromatherapy. Pt reports that his goal is to think of two things to keep him busy following discharge that will assist him in not using drugs.   Gary [name] 09/12/2016, 11:10 AM

## 2016-09-12 NOTE — Progress Notes (Signed)
Nutrition Brief Note  Patient identified on the Malnutrition Screening Tool (MST) Report  Pt's weight stable.  Wt Readings from Last 15 Encounters:  09/11/16 160 lb (72.6 kg)  09/10/16 164 lb (74.4 kg)  06/26/16 158 lb (71.7 kg)  06/18/16 149 lb (67.6 kg)  05/24/16 149 lb 9.6 oz (67.9 kg)  05/10/16 157 lb 5 oz (71.4 kg)  05/02/16 157 lb 5 oz (71.4 kg)  04/23/16 151 lb (68.5 kg)  04/16/16 160 lb (72.6 kg)  04/02/16 160 lb (72.6 kg)  03/26/16 163 lb (73.9 kg)  03/21/16 163 lb (73.9 kg)  03/13/16 163 lb (73.9 kg)  02/17/16 155 lb 12.8 oz (70.7 kg)  04/03/15 152 lb 3.2 oz (69 kg)    Body mass index is 23.63 kg/m. Patient meets criteria for normal based on current BMI.   Current diet order is regular.   Labs and medications reviewed.   No nutrition interventions warranted at this time. If nutrition issues arise, please consult RD.   Clayton Bibles, MS, RD, LDN Pager: [phone] After Hours Pager: [phone]

## 2016-09-12 NOTE — Progress Notes (Signed)
Gary [name] was up and visible this evening, did not attend evening group activity. Gary [name] appeared depressed and withdrawn this evening and did retire to his room early. Gary [name] did complain of anxiety and requested medication for pain and anxiety and received without incident. A. Support and encouragement provided. R. Safety maintained, will continue to monitor.

## 2016-09-12 NOTE — Plan of Care (Signed)
Problem: Safety: Goal: Periods of time without injury will increase Outcome: Progressing Pt is contracting with staff for safety.  Problem: Health Behavior/Discharge Planning: Goal: Compliance with therapeutic regimen will improve Outcome: Progressing Pt is taking medications as prescribed.

## 2016-09-13 MED ORDER — GABAPENTIN 400 MG PO CAPS
400.0000 mg | ORAL_CAPSULE | Freq: Three times a day (TID) | ORAL | Status: DC
  Administered 2016-09-13 – 2016-09-16 (×10): 400 mg via ORAL
  Filled 2016-09-13 (×16): qty 1

## 2016-09-13 MED ORDER — IBUPROFEN 600 MG PO TABS
600.0000 mg | ORAL_TABLET | Freq: Four times a day (QID) | ORAL | Status: DC
  Administered 2016-09-13 – 2016-09-16 (×13): 600 mg via ORAL
  Filled 2016-09-13 (×16): qty 1

## 2016-09-13 NOTE — Progress Notes (Signed)
[facility] MD Progress Note  09/13/2016 12:45 PM  Patient Active Problem List   Diagnosis Date Noted  . Cocaine use disorder, severe, dependence ([place]) 09/11/2016  . Substance induced mood disorder ([place]) 09/10/2016  . Aortoiliac occlusive disease ([place]) 05/10/2016  . PAD (peripheral artery disease) ([place]) 04/02/2016  . History of adenomatous polyp of colon 04/03/2015  . Chronic diarrhea 11/03/2014  . History of colonic polyps 11/03/2014  . GERD (gastroesophageal reflux disease) 12/06/2013  . Early satiety 12/06/2013  . Encounter for screening colonoscopy 12/06/2013  . Depression, major 05/30/2011  . Open gastric injury 05/30/2011  . Injury of diaphragm with open wound into cavity 05/30/2011    Diagnosis: Cocaine use disorder, depression  Subjective: Patient states his mood is a little better" today he denies any current suicidal or homicidal ideation, plan or intent. He denies any medication side effects. He is working with Education officer, museum for possible [facility] referral or possible [facility] referral  Objective: Well-developed well-nourished man in no apparent distress pleasant and appropriate mood is "a little better" affect is calm and unremarkable thought processes linear and goal-directed thought content no current suicidal or homicidal ideation, plan or intent, alert and oriented insight and judgment are fair IQ appears an average range     Current Facility-Administered Medications (Cardiovascular):  .  atorvastatin (LIPITOR) tablet 10 mg     Current Facility-Administered Medications (Analgesics):  .  acetaminophen (TYLENOL) tablet 650 mg .  aspirin chewable tablet 81 mg .  ibuprofen (ADVIL,MOTRIN) tablet 600 mg     Current Facility-Administered Medications (Other):  .  alum & mag hydroxide-simeth (MAALOX/MYLANTA) 200-200-20 MG/5ML suspension 30 mL .  cyclobenzaprine (FLEXERIL) tablet 10 mg .  DULoxetine (CYMBALTA) DR capsule 20 mg .  gabapentin (NEURONTIN) capsule 400 mg .   hydrOXYzine (ATARAX/VISTARIL) tablet 25 mg .  magnesium hydroxide (MILK OF MAGNESIA) suspension 30 mL .  nicotine (NICODERM CQ - dosed in mg/24 hours) patch 21 mg .  traZODone (DESYREL) tablet 50 mg  No current outpatient prescriptions on file.  Vital Signs:Blood pressure 118/64, pulse 65, temperature 98.4 F (36.9 C), temperature source Oral, resp. rate 20, height 5\' 9"  (1.753 m), weight 72.6 kg (160 lb).    Lab Results: No results found for this or any previous visit (from the past 48 hour(s)).  Physical Findings: AIMS: Facial and Oral Movements Muscles of Facial Expression: None, normal Lips and Perioral Area: None, normal Jaw: None, normal Tongue: None, normal,Extremity Movements Upper (arms, wrists, hands, fingers): None, normal Lower (legs, knees, ankles, toes): None, normal, Trunk Movements Neck, shoulders, hips: None, normal, Overall Severity Severity of abnormal movements (highest score from questions above): None, normal Incapacitation due to abnormal movements: None, normal Patient's awareness of abnormal movements (rate only patient's report): No Awareness, Dental Status Current problems with teeth and/or dentures?: No Does patient usually wear dentures?: No  CIWA:    COWS:      Assessment/Plan: Patient appears to be benefiting from current medications without side effects and these will continue. Patient is continuing to work with Education officer, museum on placement for further treatment of cocaine use disorder.  Linard Millers, MD 09/13/2016, 12:45 PM

## 2016-09-13 NOTE — BHH Group Notes (Signed)
[place] LCSW Group Therapy  09/13/2016 12:56 PM  Type of Therapy:  Group Therapy  Participation Level:  Active  Participation Quality:  Attentive  Affect:  Appropriate  Cognitive:  Alert   Insight:  Improving  Engagement in Therapy:  Improving  Modes of Intervention:  Discussion, Education, Exploration, Problem-solving, Rapport Building, Socialization and Support  Summary of Progress/Problems: Feelings around Relapse. Group members discussed the meaning of relapse and shared personal stories of relapse, how it affected them and others, and how they perceived themselves during this time. Group members were encouraged to identify triggers, warning signs and coping skills used when facing the possibility of relapse. Social supports were discussed and explored in detail. Post Acute Withdrawal Syndrome (handout provided) was introduced and examined. Pt's were encouraged to ask questions, talk about key points associated with PAWS, and process this information in terms of relapse prevention. Gary [name] was attentive and engaged during today's processing group. He shared that he hopes to get into rehab from here and has been speaking with his support network about other options as well. "I have to go to treatment or I won't make it for long." Gary [name] continues to show progress in the group setting with improving insight.   Michalene Debruler N Smart LCSW 09/13/2016, 12:56 PM

## 2016-09-13 NOTE — Progress Notes (Signed)
Patient did attend the evening karaoke group. Pt was attentive and supportive but did not participate by singing a song.   

## 2016-09-13 NOTE — Progress Notes (Signed)
Data. Patient denies SI/HI/AVH. Patient interacting minimally with staff and other patients, though he has spent much of shift in the day room. His affect is flat and does not brighten with interaction. He reports chronic heartburn and chronic pain in his back. MD notified of chronic heartburn. Patient did not complete a self assessment this shift. Action. PRNs given for C/O heartburn and pain, with positive effect. Emotional support and encouragement offered. Education provided on medication, indications and side effect. Q 15 minute checks done for safety. Response. Safety on the unit maintained through 15 minute checks.  Medications taken as prescribed. Attended groups. Remained calm and appropriate through out shift.

## 2016-09-13 NOTE — Progress Notes (Signed)
D: Pt endorses moderate depression and anxiety; states, "they are still high but not as bad as they were yesterday." Pt also complained of moderate back pain. Pt denies SI, HI or AVH. Pt remained calm and cooperative.  A: Medications offered as prescribed.  Support, encouragement, and safe environment provided.  15-minute safety checks continue.  R: Pt was med compliant.  Pt attended [place] group. Safety checks continue

## 2016-09-13 NOTE — Progress Notes (Signed)
Recreation Therapy Notes  Date: 09/13/16 Time: 0930 Location: [address] Hall Group Room  Group Topic: Stress Management  Goal Area(s) Addresses:  Patient will verbalize importance of using healthy stress management.  Patient will identify positive emotions associated with healthy stress management.   Behavioral Response: Engaged  Intervention: Calm App  Activity :  Forgiveness Meditation.  LRT introduced the stress management technique of meditation.  LRT played a meditation on forgiveness to allow patients to engage in the meditation.  Patients were to follow along to with the meditation to engage in the technique.  Education:  Stress Management, Discharge Planning.   Education Outcome: Acknowledges edcuation/In group clarification offered/Needs additional education  Clinical Observations/Feedback: Pt attended group.   Victorino Sparrow, LRT/CTRS         Victorino Sparrow A 09/13/2016 11:37 AM

## 2016-09-13 NOTE — Progress Notes (Signed)
Patient attended wrap-up group, but he did not participate.

## 2016-09-14 MED ORDER — DULOXETINE HCL 20 MG PO CPEP
20.0000 mg | ORAL_CAPSULE | Freq: Two times a day (BID) | ORAL | Status: DC
  Administered 2016-09-14 – 2016-09-16 (×4): 20 mg via ORAL
  Filled 2016-09-14 (×9): qty 1

## 2016-09-14 MED ORDER — BACLOFEN 10 MG PO TABS: 10.0000 mg | ORAL_TABLET | Freq: Three times a day (TID) | ORAL | Status: DC | PRN

## 2016-09-14 MED ORDER — ENSURE ENLIVE PO LIQD
237.0000 mL | Freq: Two times a day (BID) | ORAL | Status: DC
  Administered 2016-09-15: 237 mL via ORAL

## 2016-09-14 MED ORDER — LIDOCAINE 5 % EX PTCH
1.0000 | MEDICATED_PATCH | CUTANEOUS | Status: DC
  Administered 2016-09-14: 1 via TRANSDERMAL
  Filled 2016-09-14 (×5): qty 1

## 2016-09-14 NOTE — [facility] Group Notes (Signed)
Adult Therapy Group Note  Date:  09/14/2016  Time:  10:00-11:00AM  Group Topic/Focus: Unhealthy vs Healthy Coping Techniques  Building Self Esteem:   The focus of this group was to assist patients in becoming aware of the differences between healthy and unhealthy coping techniques, as well as how to determine which type they are using.  The benefits of learning healthier coping were discussed.   An exercise was used to identify patients' fears and to demonstrate that in general, their fears are commonly experienced.  This helped patients to provide support to each other and to determine that, in fact, they are not alone.   The whiteboard was utilized to record the discussion, and many patients copied this down.  Participation Level:  Active  Participation Quality:  Resistant and Sharing  Affect:  Irritable  Cognitive:  Alert  Insight: Lacking  Engagement in Group:  Limited  Modes of Intervention:  Exercise, Discussion and Support  Additional Comments:  The patient expressed that he fears nothing, refused to participate in the exercise initially but did eventually participate in the discussion.  He placed a lot of blame on the people in his life for abandoning him when he needed help, even spoke about his daughter refusing to take him to a food pantry when he was hungry.  He was able to acknowledge that coping with that by slapping her in the face, as he stated he wanted to do, would have been unhealthy because it would have led to even more problems.  He initiated side conversations and was difficult to redirect.  Selmer Dominion, LCSW 09/14/2016   12:35pm

## 2016-09-14 NOTE — Progress Notes (Signed)
D: Pt endorses mild depression and anxiety; states, "I think I am ready to go home; I think this is the best my depression and anxiety can be." Pt also complained of moderate back pain. Pt denies SI, HI or AVH. Pt remained calm and cooperative. A: Medications offered as prescribed.  Support, encouragement, and safe environment provided.  15-minute safety checks continue. R: Pt was med compliant.  Pt attended wrap-up group. Safety checks continue.

## 2016-09-14 NOTE — Plan of Care (Signed)
Problem: Medication: Goal: Compliance with prescribed medication regimen will improve Outcome: Progressing Patient is taking his medications as ordered.
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
## 2016-09-14 NOTE — Progress Notes (Signed)
Data. Patient denies SI/HI/AVH. Patient interacting with staff and other patients. Affect is irritable and does not brighten. He has had multiple requests for medications and C/O pain and requested pain medications. NP notified. Patient did not complete his self assessment this shift. Action. Emotional support and encouragement offered. Education provided on medication, indications and side effect. Q 15 minute checks done for safety. Response. Safety on the unit maintained through 15 minute checks.  Medications taken as prescribed. Attended groups. Remained calm and appropriate through out shift.

## 2016-09-14 NOTE — [facility] Group Notes (Addendum)
[place] Group Notes:  (Nursing/MHT/Case Management/Adjunct)  Date:  09/14/2016  Time:  2:15 PM  Type of Therapy:  Nurse Education  Participation Level:  Active  Participation Quality:  Appropriate  Affect:  Appropriate  Cognitive:  Appropriate  Insight:  Appropriate  Engagement in Group:  Engaged  Modes of Intervention:  Education  Summary of Progress/Problems: Follow up of AM group. Patient was able to identify three things to be thankful for.  Cheri Kearns 09/14/2016, 2:15 PM

## 2016-09-14 NOTE — [facility] Group Notes (Signed)
[place] Group Notes:  (Nursing/MHT/Case Management/Adjunct)  Date:  09/14/2016  Time:  11:02 AM  Type of Therapy:  Nurse Education  Participation Level:  Did Not Attend   Cheri Kearns 09/14/2016, 11:02 AM

## 2016-09-14 NOTE — Progress Notes (Signed)
Patient attended wrap-up group and said that his day was a 6. Patient said that he did not have any goal for today today, but  his coping skills were watching tv and coloring.

## 2016-09-15 MED ORDER — BISMUTH SUBSALICYLATE 262 MG PO CHEW
524.0000 mg | CHEWABLE_TABLET | Freq: Three times a day (TID) | ORAL | Status: DC
  Administered 2016-09-15 (×2): 524 mg via ORAL
  Filled 2016-09-15 (×4): qty 2

## 2016-09-15 NOTE — Progress Notes (Signed)
DAR NOTE: Patient presents with anxious affect and depressed mood. Pt was irritable this morning about his lidocaine patch schedule. Denies pain, auditory and visual hallucinations.  Rates depression at 5, hopelessness at 6, and anxiety at 6.  Maintained on routine safety checks.  Medications given as prescribed.  Support and encouragement offered as needed. Patient observed socializing with peers in the dayroom. Will continue to monitor.

## 2016-09-15 NOTE — Progress Notes (Signed)
Patient attended AA group meeting.  

## 2016-09-15 NOTE — [facility] Group Notes (Signed)
[place] Group Notes: (Clinical Social Work)   09/15/2016      Type of Therapy:  Group Therapy   Participation Level:  Did Not Attend despite MHT prompting   Selmer Dominion, LCSW 09/15/2016, 12:07 PM
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
## 2016-09-15 NOTE — Progress Notes (Signed)
D: Pt endorses moderate lower back pain; states, "my back pain is not getting any better." Pt at the time of assessment denies depression, anxiety, SI, HI or AVH; states, "I am ready to go home." Pt remained flat and withdrawn to self even while in the dayroom. A: Medications offered as prescribed.  Support, encouragement, and safe environment provided.  15-minute safety checks continue. R: Pt was med compliant.  Pt attended wrap-up group. Safety checks continue.

## 2016-09-16 ENCOUNTER — Other Ambulatory Visit: Payer: Self-pay | Admitting: *Deleted

## 2016-09-16 DIAGNOSIS — I739 Peripheral vascular disease, unspecified: Secondary | ICD-10-CM

## 2016-09-16 MED ORDER — GABAPENTIN 400 MG PO CAPS: 400.0000 mg | ORAL_CAPSULE | Freq: Three times a day (TID) | ORAL | 0 refills | Status: DC

## 2016-09-16 MED ORDER — ATORVASTATIN CALCIUM 10 MG PO TABS: 10.0000 mg | ORAL_TABLET | Freq: Every day | ORAL | 0 refills | Status: DC

## 2016-09-16 MED ORDER — BISMUTH SUBSALICYLATE 262 MG PO CHEW
524.0000 mg | CHEWABLE_TABLET | Freq: Four times a day (QID) | ORAL | Status: DC | PRN
  Administered 2016-09-16: 524 mg via ORAL
  Filled 2016-09-16 (×2): qty 2

## 2016-09-16 MED ORDER — HYDROXYZINE HCL 25 MG PO TABS: 25.0000 mg | ORAL_TABLET | Freq: Four times a day (QID) | ORAL | 0 refills | Status: DC | PRN

## 2016-09-16 MED ORDER — TRAZODONE HCL 50 MG PO TABS: 50.0000 mg | ORAL_TABLET | Freq: Every evening | ORAL | 0 refills | Status: DC | PRN

## 2016-09-16 MED ORDER — ASPIRIN 81 MG PO CHEW: 81.0000 mg | CHEWABLE_TABLET | Freq: Every day | ORAL | 0 refills | Status: DC

## 2016-09-16 MED ORDER — NICOTINE 21 MG/24HR TD PT24: 21.0000 mg | MEDICATED_PATCH | Freq: Every day | TRANSDERMAL | 0 refills | Status: DC

## 2016-09-16 MED ORDER — DULOXETINE HCL 20 MG PO CPEP: 20.0000 mg | ORAL_CAPSULE | Freq: Two times a day (BID) | ORAL | 0 refills | Status: DC

## 2016-09-16 NOTE — Progress Notes (Signed)
Patient discharged to lobby. Patient was stable and appreciative at that time. All papers and prescriptions were given and valuables returned. Verbal understanding expressed. Denies SI/HI and A/VH. Patient given opportunity to express concerns and ask questions.  

## 2016-09-16 NOTE — Progress Notes (Signed)
Recreation Therapy Notes  Date: 09/16/16 Time: 0930 Location: [address] Dayroom  Group Topic: Stress Management  Goal Area(s) Addresses:  Patient will verbalize importance of using healthy stress management.  Patient will identify positive emotions associated with healthy stress management.   Behavioral Response: Engaged  Intervention: Guided Imagery  Activity :  Depression Imagery.  LRT introduced the the stress management technique of guided imagery.  LRT read a script to allow patients to engage in the technique of guided imagery.  Patients were to follow along as LRT read script to engage in the technique.  Education:  Stress Management, Discharge Planning.   Education Outcome: Acknowledges edcuation/In group clarification offered/Needs additional education  Clinical Observations/Feedback: Pt attended group.    Victorino Sparrow, LRT/CTRS         Victorino Sparrow A 09/16/2016 12:22 PM

## 2016-09-16 NOTE — Progress Notes (Signed)
  [facility] Adult Case Management Discharge Plan :  Will you be returning to the same living situation after discharge:  Yes,  home At discharge, do you have transportation home?: Yes,  ride after lunch today. Do you have the ability to pay for your medications: Yes,  Medicare  Release of information consent forms completed and submitted to medical records by CSW.  Patient to Follow up at: Follow-up Information    [facility] Follow up on 09/18/2016.   Why:  Hospital follow-up on this date at 8:30AM. Please bring proof of social security income to this appt. Thank you.  Contact information: [place] [address] [phone]        [facility] Follow up.   Why:  Referral made: 09/11/16. If you are still interested in this facility, please call Shayla to check status of referral and waitlist. Thank you.  Contact information: [address] [place], [place] [address] Phone: [phone] Fax: [phone]          Next level of care provider has access to [place] and Suicide Prevention discussed: Yes,  SPE completed with pt's daughter.  Have you used any form of tobacco in the last 30 days? (Cigarettes, Smokeless Tobacco, Cigars, and/or Pipes): Yes  Has patient been referred to the Quitline?: Patient refused referral  Patient has been referred for addiction treatment: Yes  Kay Shippy N Smart LCSW 09/16/2016, 10:47 AM
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
## 2016-09-16 NOTE — [facility] Suicide Risk Assessment (Signed)
[facility] Discharge Suicide Risk Assessment   Principal Problem: Cocaine use disorder, severe, dependence [facility]) Discharge Diagnoses:  Patient Active Problem List   Diagnosis Date Noted  . Cocaine use disorder, severe, dependence ([place]) [F14.20] 09/11/2016  . Substance induced mood disorder ([place]) [F19.94] 09/10/2016  . Aortoiliac occlusive disease ([place]) [I74.09] 05/10/2016  . PAD (peripheral artery disease) ([place]) [I73.9] 04/02/2016  . History of adenomatous polyp of colon [Z86.010] 04/03/2015  . Chronic diarrhea [K52.9] 11/03/2014  . History of colonic polyps [Z86.010] 11/03/2014  . GERD (gastroesophageal reflux disease) [K21.9] 12/06/2013  . Early satiety [R68.81] 12/06/2013  . Encounter for screening colonoscopy [Z12.11] 12/06/2013  . Depression, major [F32.9] 05/30/2011  . Open gastric injury [S36.30XA] 05/30/2011  . Injury of diaphragm with open wound into cavity [S27.809A, S21.309A] 05/30/2011    Total Time spent with patient: 15 minutes  Musculoskeletal: Strength & Muscle Tone: within normal limits Gait & Station: normal Patient leans: N/A  Psychiatric Specialty Exam: ROS  Blood pressure 128/65, pulse 60, temperature 98.3 F (36.8 C), temperature source Oral, resp. rate 20, height 5\' 9"  (1.753 m), weight 72.6 kg (160 lb).Body mass index is 23.63 kg/m.  General Appearance: Casual  Eye Contact::  Good  Speech:  Clear and Coherent  Volume:  Normal  Mood:  Euthymic  Affect:  Congruent  Thought Process:  Coherent  Orientation:  Full (Time, Place, and Person)  Thought Content:  Negative  Suicidal Thoughts:  No  Homicidal Thoughts:  No  Memory:  Negative  Judgement:  Fair  Insight:  Fair  Psychomotor Activity:  Normal  Concentration:  Good  Recall:  Good  Fund of Knowledge:Good  Language: Good  Akathisia:  No  Handed:  Right  AIMS (if indicated):     Assets:  Resilience  Sleep:  Number of Hours: 6  Cognition: WNL  ADL's:  Intact   Mental Status Per Nursing  Assessment::   On Admission:     Demographic Factors:  Male  Loss Factors: Loss of significant relationship  Historical Factors: Prior suicide attempts  Risk Reduction Factors:   Positive coping skills or problem solving skills  Continued Clinical Symptoms:  Alcohol/Substance Abuse/Dependencies  Cognitive Features That Contribute To Risk:  None    Suicide Risk:  Mild:  Suicidal ideation of limited frequency, intensity, duration, and specificity.  There are no identifiable plans, no associated intent, mild dysphoria and related symptoms, good self-control (both objective and subjective assessment), few other risk factors, and identifiable protective factors, including available and accessible social support.  Follow-up Information    [facility] Follow up on 09/18/2016.   Why:  Hospital follow-up on this date at 8:30AM. Please bring proof of social security income to this appt. Thank you.  Contact information: [place] [address] [phone]        [facility] Follow up.   Why:  Referral made: 09/11/16. If you are still interested in this facility, please call Shayla to check status of referral and waitlist. Thank you.  Contact information: [address] [place], [place] [address] Phone: [phone] Fax: [phone]          Plan Of Care/Follow-up recommendations:  Other:  She denies any current suicidal or homicidal ideation, plan or intent. He is encouraged to follow-up as needed for further treatment of his substance use and mental health issues after discharge.  Linard Millers, MD 09/16/2016, 10:44 AM

## 2016-09-16 NOTE — Tx Team (Signed)
Interdisciplinary Treatment and Diagnostic Plan Update  09/16/2016 Time of Session: 9:30AM Gary [name] MRN: [id number]  Principal Diagnosis: Cocaine use disorder, severe, dependence ([place])  Secondary Diagnoses: Principal Problem:   Cocaine use disorder, severe, dependence ([place]) Active Problems:   Substance induced mood disorder ([facility])   Current Medications:  Current Facility-Administered Medications  Medication Dose Route Frequency Provider Last Rate Last Dose  . acetaminophen (TYLENOL) tablet 650 mg  650 mg Oral Q6H PRN Laverle Hobby, PA-C   650 mg at 09/14/16 1452  . alum & mag hydroxide-simeth (MAALOX/MYLANTA) 200-200-20 MG/5ML suspension 30 mL  30 mL Oral Q4H PRN Laverle Hobby, PA-C   30 mL at 09/14/16 1633  . aspirin chewable tablet 81 mg  81 mg Oral Daily Laverle Hobby, PA-C   81 mg at 09/16/16 8563  . atorvastatin (LIPITOR) tablet 10 mg  10 mg Oral q1800 Laverle Hobby, PA-C   10 mg at 09/15/16 1823  . baclofen (LIORESAL) tablet 10 mg  10 mg Oral Q8H PRN Benjamine Mola, FNP      . bismuth subsalicylate (PEPTO BISMOL) chewable tablet 524 mg  524 mg Oral QID PRN Linard Millers, MD   524 mg at 09/16/16 0836  . DULoxetine (CYMBALTA) DR capsule 20 mg  20 mg Oral BID Benjamine Mola, FNP   20 mg at 09/16/16 0835  . gabapentin (NEURONTIN) capsule 400 mg  400 mg Oral TID Linard Millers, MD   400 mg at 09/16/16 1497  . hydrOXYzine (ATARAX/VISTARIL) tablet 25 mg  25 mg Oral Q6H PRN Laverle Hobby, PA-C   25 mg at 09/14/16 2117  . ibuprofen (ADVIL,MOTRIN) tablet 600 mg  600 mg Oral QID Linard Millers, MD   600 mg at 09/16/16 0836  . lidocaine (LIDODERM) 5 % 1 patch  1 patch Transdermal Q24H Benjamine Mola, FNP   1 patch at 09/14/16 1959  . magnesium hydroxide (MILK OF MAGNESIA) suspension 30 mL  30 mL Oral Daily PRN Laverle Hobby, PA-C      . nicotine (NICODERM CQ - dosed in mg/24 hours) patch 21 mg  21 mg Transdermal Daily Laverle Hobby, PA-C   21 mg at  09/14/16 0263  . traZODone (DESYREL) tablet 50 mg  50 mg Oral QHS PRN Linard Millers, MD   50 mg at 09/15/16 2121   PTA Medications: Facility-Administered Medications Prior to Admission  Medication Dose Route Frequency Provider Last Rate Last Dose  . atorvastatin (LIPITOR) tablet 10 mg  10 mg Oral q1800 Alvia [place], PA-C       Prescriptions Prior to Admission  Medication Sig Dispense Refill Last Dose  . aspirin 81 MG tablet Take 81 mg by mouth daily.   09/10/2016 at Unknown time  . HYDROcodone-acetaminophen (NORCO/VICODIN) 5-325 MG tablet Take 1 tablet by mouth every 6 (six) hours as needed for moderate pain (Must last 30 days.Do not take and drive a car or use machinery.). 90 tablet 0 09/10/2016 at Unknown time    Patient Stressors: Medication change or noncompliance Substance abuse  Patient Strengths: Ability for insight Average or above average intelligence Capable of independent living [employer] of knowledge Motivation for treatment/growth  Treatment Modalities: Medication Management, Group therapy, Case management,  1 to 1 session with clinician, Psychoeducation, Recreational therapy.   Physician Treatment Plan for Primary Diagnosis: Cocaine use disorder, severe, dependence ([place]) Long Term Goal(s): Improvement in symptoms so as ready for discharge Improvement in symptoms  so as ready for discharge   Short Term Goals: Ability to verbalize feelings will improve Ability to disclose and discuss suicidal ideas Ability to demonstrate self-control will improve Ability to identify changes in lifestyle to reduce recurrence of condition will improve Ability to identify triggers associated with substance abuse/mental health issues will improve  Medication Management: Evaluate patient's response, side effects, and tolerance of medication regimen.  Therapeutic Interventions: 1 to 1 sessions, Unit Group sessions and Medication administration.  Evaluation of Outcomes:  Met  Physician Treatment Plan for Secondary Diagnosis: Principal Problem:   Cocaine use disorder, severe, dependence ([name]) Active Problems:   Substance induced mood disorder ([place])  Long Term Goal(s): Improvement in symptoms so as ready for discharge Improvement in symptoms so as ready for discharge   Short Term Goals: Ability to verbalize feelings will improve Ability to disclose and discuss suicidal ideas Ability to demonstrate self-control will improve Ability to identify changes in lifestyle to reduce recurrence of condition will improve Ability to identify triggers associated with substance abuse/mental health issues will improve     Medication Management: Evaluate patient's response, side effects, and tolerance of medication regimen.  Therapeutic Interventions: 1 to 1 sessions, Unit Group sessions and Medication administration.  Evaluation of Outcomes: Met   RN Treatment Plan for Primary Diagnosis: Cocaine use disorder, severe, dependence ([name]) Long Term Goal(s): Knowledge of disease and therapeutic regimen to maintain health will improve  Short Term Goals: Ability to remain free from injury will improve, Ability to disclose and discuss suicidal ideas and Ability to identify and develop effective coping behaviors will improve  Medication Management: RN will administer medications as ordered by provider, will assess and evaluate patient's response and provide education to patient for prescribed medication. RN will report any adverse and/or side effects to prescribing provider.  Therapeutic Interventions: 1 on 1 counseling sessions, Psychoeducation, Medication administration, Evaluate responses to treatment, Monitor vital signs and CBGs as ordered, Perform/monitor CIWA, COWS, AIMS and Fall Risk screenings as ordered, Perform wound care treatments as ordered.  Evaluation of Outcomes: Met   LCSW Treatment Plan for Primary Diagnosis: Cocaine use disorder, severe, dependence  ([place]) Long Term Goal(s): Safe transition to appropriate next level of care at discharge, Engage patient in therapeutic group addressing interpersonal concerns.  Short Term Goals: Engage patient in aftercare planning with referrals and resources, Increase social support, Facilitate patient progression through stages of change regarding substance use diagnoses and concerns, Identify triggers associated with mental health/substance abuse issues and Increase skills for wellness and recovery  Therapeutic Interventions: Assess for all discharge needs, 1 to 1 time with Social worker, Explore available resources and support systems, Assess for adequacy in community support network, Educate family and significant other(s) on suicide prevention, Complete Psychosocial Assessment, Interpersonal group therapy.  Evaluation of Outcomes: Met  Progress in Treatment: Attending groups: Yes Participating in groups: Yes Taking medication as prescribed: Yes. Toleration medication: Yes. Family/Significant other contact made: SPE completed with pt's daughter.  Patient understands diagnosis: Yes. Discussing patient identified problems/goals with staff: Yes. Medical problems stabilized or resolved: Yes. Denies suicidal/homicidal ideation: Yes, self report.  Issues/concerns per patient self-inventory: No. Other: n/a  New problem(s) identified: No, Describe:  n/a  New Short Term/Long Term Goal(s): Medication management for mood stabilization, detox, and development of comprehensive mental wellness/sobriety plan.   Discharge Plan or Barriers: Pt referred to [facility]-no bed available. He plans to follow-up at [facility] for outpatient services. Pt given Mental Health Association information and AA list for [place].  Pt plans to return home and will have ride this afternoon.   Reason for Continuation of Hospitalization: none  Estimated Length of Stay: d/c today   Attendees: Patient: 09/16/2016  10:40 AM  Physician: Dr. Sharolyn Douglas MD 09/16/2016 10:40 AM  Nursing: Conley Canal RN 09/16/2016 10:40 AM  RN Care Manager: Lars Pinks CM 09/16/2016 10:40 AM  Social Worker:  Maxie Better, LCSW 09/16/2016 10:40 AM  Recreational Therapist:  09/16/2016 10:40 AM  Other: Samuel Jester NP; Lindell Spar NP 09/16/2016 10:40 AM  Other:  09/16/2016 10:40 AM  Other: 09/16/2016 10:40 AM    Scribe for Treatment Team: [place], LCSW 09/16/2016 10:40 AM

## 2016-09-16 NOTE — Progress Notes (Signed)
D: Pt is flat and withdrawn to self even while in the dayroom. Pt at the time of assessment denies depression, anxiety, SI, HI or AVH; states, "I am ready to go home; my body is here but my mind is not." Pt complained of moderate lower back pain; states, "my back pain is not getting any better." A: Medications offered as prescribed.  Support, encouragement, and safe environment provided.  15-minute safety checks continue. R: Pt was med compliant however; refused his lidocaine patch-"that patch don't do nothing for my back pain."  Pt attended [place] group. Safety checks continue.

## 2016-09-17 ENCOUNTER — Ambulatory Visit: Payer: Self-pay | Admitting: Family

## 2016-09-17 ENCOUNTER — Encounter (HOSPITAL_COMMUNITY): Payer: Self-pay

## 2016-09-18 ENCOUNTER — Inpatient Hospital Stay (HOSPITAL_COMMUNITY): Admission: RE | Admit: 2016-09-18 | Payer: Self-pay | Source: Ambulatory Visit

## 2016-09-18 ENCOUNTER — Ambulatory Visit: Payer: Self-pay | Admitting: Family

## 2016-09-25 ENCOUNTER — Ambulatory Visit: Payer: Self-pay | Admitting: Orthopaedic Surgery

## 2016-09-25 DIAGNOSIS — M7989 Other specified soft tissue disorders: Secondary | ICD-10-CM | POA: Diagnosis not present

## 2016-09-25 DIAGNOSIS — Z7982 Long term (current) use of aspirin: Secondary | ICD-10-CM | POA: Diagnosis not present

## 2016-09-25 DIAGNOSIS — F17210 Nicotine dependence, cigarettes, uncomplicated: Secondary | ICD-10-CM | POA: Diagnosis not present

## 2016-09-25 DIAGNOSIS — Z79899 Other long term (current) drug therapy: Secondary | ICD-10-CM | POA: Diagnosis not present

## 2016-09-25 DIAGNOSIS — R600 Localized edema: Secondary | ICD-10-CM | POA: Diagnosis not present

## 2016-09-25 DIAGNOSIS — R590 Localized enlarged lymph nodes: Secondary | ICD-10-CM | POA: Diagnosis not present

## 2016-10-28 DIAGNOSIS — F200 Paranoid schizophrenia: Secondary | ICD-10-CM | POA: Diagnosis not present

## 2016-11-05 DIAGNOSIS — F200 Paranoid schizophrenia: Secondary | ICD-10-CM | POA: Diagnosis not present

## 2016-11-07 ENCOUNTER — Ambulatory Visit: Payer: Self-pay | Admitting: Orthopaedic Surgery

## 2016-11-08 ENCOUNTER — Encounter: Payer: Self-pay | Admitting: Orthopaedic Surgery

## 2016-11-16 IMAGING — NM NM MYOCAR MULTI W/SPECT W/WALL MOTION & EF
2 series · 12 of 12 positions shown · non-contrast
Comparison: none

[Series 1: rest · 8.28mm/px · 6 of 64 frames shown]
[frame 6/64]
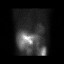
[frame 16/64]
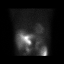
[frame 27/64]
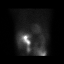
[frame 38/64]
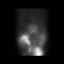
[frame 48/64]
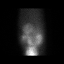
[frame 59/64]
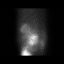

[Series 2: stress gated · 8.28mm/px · 6 of 64 frames shown]
[frame 6/64]
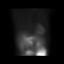
[frame 16/64]
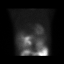
[frame 27/64]
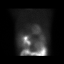
[frame 38/64]
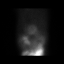
[frame 48/64]
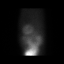
[frame 59/64]
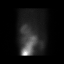

[12 of 12 positions shown; findings below may reference images not displayed]

Canned report from images found in remote index.

Refer to host system for actual result text.

## 2016-11-18 ENCOUNTER — Encounter: Payer: Self-pay | Admitting: Family

## 2016-11-26 ENCOUNTER — Encounter: Payer: Self-pay | Admitting: Family

## 2016-11-26 ENCOUNTER — Ambulatory Visit (HOSPITAL_COMMUNITY)
Admission: RE | Admit: 2016-11-26 | Discharge: 2016-11-26 | Disposition: A | Discharge status: Home / Self Care | Payer: Medicare Other | Source: Ambulatory Visit | Attending: Family | Admitting: Family

## 2016-11-26 ENCOUNTER — Ambulatory Visit (INDEPENDENT_AMBULATORY_CARE_PROVIDER_SITE_OTHER): Payer: Medicare Other | Admitting: Family

## 2016-11-26 VITALS — BP 122/70 | HR 62 | Temp 97.4°F | Resp 16 | Ht 69.0 in | Wt 182.0 lb

## 2016-11-26 DIAGNOSIS — F17200 Nicotine dependence, unspecified, uncomplicated: Secondary | ICD-10-CM

## 2016-11-26 DIAGNOSIS — E785 Hyperlipidemia, unspecified: Secondary | ICD-10-CM | POA: Diagnosis not present

## 2016-11-26 DIAGNOSIS — Z95828 Presence of other vascular implants and grafts: Secondary | ICD-10-CM

## 2016-11-26 DIAGNOSIS — I739 Peripheral vascular disease, unspecified: Secondary | ICD-10-CM | POA: Diagnosis present

## 2016-11-26 DIAGNOSIS — R1011 Right upper quadrant pain: Secondary | ICD-10-CM

## 2016-11-26 DIAGNOSIS — I7409 Other arterial embolism and thrombosis of abdominal aorta: Secondary | ICD-10-CM | POA: Diagnosis not present

## 2016-11-26 NOTE — Patient Instructions (Signed)
Peripheral Vascular Disease Peripheral vascular disease (PVD) is a disease of the blood vessels that are not part of your heart and brain. A simple term for PVD is poor circulation. In most cases, PVD narrows the blood vessels that carry blood from your heart to the rest of your body. This can result in a decreased supply of blood to your arms, legs, and internal organs, like your stomach or kidneys. However, it most often affects a person's lower legs and feet. There are two types of PVD.  Organic PVD. This is the more common type. It is caused by damage to the structure of blood vessels.  Functional PVD. This is caused by conditions that make blood vessels contract and tighten (spasm). Without treatment, PVD tends to get worse over time. PVD can also lead to acute ischemic limb. This is when an arm or limb suddenly has trouble getting enough blood. This is a medical emergency. Follow these instructions at home:  Take medicines only as told by your doctor.  Do not use any tobacco products, including cigarettes, chewing tobacco, or electronic cigarettes. If you need help quitting, ask your doctor.  Lose weight if you are overweight, and maintain a healthy weight as told by your doctor.  Eat a diet that is low in fat and cholesterol. If you need help, ask your doctor.  Exercise regularly. Ask your doctor for some good activities for you.  Take good care of your feet.  Wear comfortable shoes that fit well.  Check your feet often for any cuts or sores. Contact a doctor if:  You have cramps in your legs while walking.  You have leg pain when you are at rest.  You have coldness in a leg or foot.  Your skin changes.  You are unable to get or have an erection (erectile dysfunction).  You have cuts or sores on your feet that are not healing. Get help right away if:  Your arm or leg turns cold and blue.  Your arms or legs become red, warm, swollen, painful, or numb.  You have  chest pain or trouble breathing.  You suddenly have weakness in your face, arm, or leg.  You become very confused or you cannot speak.  You suddenly have a very bad headache.  You suddenly cannot see. This information is not intended to replace advice given to you by your health care provider. Make sure you discuss any questions you have with your health care provider. Document Released: 12/18/2009 Document Revised: 02/29/2016 Document Reviewed: 03/03/2014 Elsevier Interactive Patient Education  2017 Elsevier Inc.      Steps to Quit Smoking Smoking tobacco can be bad for your health. It can also affect almost every organ in your body. Smoking puts you and people around you at risk for many serious long-lasting (chronic) diseases. Quitting smoking is hard, but it is one of the best things that you can do for your health. It is never too late to quit. What are the benefits of quitting smoking? When you quit smoking, you lower your risk for getting serious diseases and conditions. They can include:  Lung cancer or lung disease.  Heart disease.  Stroke.  Heart attack.  Not being able to have children (infertility).  Weak bones (osteoporosis) and broken bones (fractures). If you have coughing, wheezing, and shortness of breath, those symptoms may get better when you quit. You may also get sick less often. If you are pregnant, quitting smoking can help to lower your chances   of having a baby of low birth weight. What can I do to help me quit smoking? Talk with your doctor about what can help you quit smoking. Some things you can do (strategies) include:  Quitting smoking totally, instead of slowly cutting back how much you smoke over a period of time.  Going to in-person counseling. You are more likely to quit if you go to many counseling sessions.  Using resources and support systems, such as:  Online chats with a counselor.  Phone quitlines.  Printed self-help  materials.  Support groups or group counseling.  Text messaging programs.  Mobile phone apps or applications.  Taking medicines. Some of these medicines may have nicotine in them. If you are pregnant or breastfeeding, do not take any medicines to quit smoking unless your doctor says it is okay. Talk with your doctor about counseling or other things that can help you. Talk with your doctor about using more than one strategy at the same time, such as taking medicines while you are also going to in-person counseling. This can help make quitting easier. What things can I do to make it easier to quit? Quitting smoking might feel very hard at first, but there is a lot that you can do to make it easier. Take these steps:  Talk to your family and friends. Ask them to support and encourage you.  Call phone quitlines, reach out to support groups, or work with a counselor.  Ask people who smoke to not smoke around you.  Avoid places that make you want (trigger) to smoke, such as:  Bars.  Parties.  Smoke-break areas at work.  Spend time with people who do not smoke.  Lower the stress in your life. Stress can make you want to smoke. Try these things to help your stress:  Getting regular exercise.  Deep-breathing exercises.  Yoga.  Meditating.  Doing a body scan. To do this, close your eyes, focus on one area of your body at a time from head to toe, and notice which parts of your body are tense. Try to relax the muscles in those areas.  Download or buy apps on your mobile phone or tablet that can help you stick to your quit plan. There are many free apps, such as QuitGuide from the CDC (Centers for Disease Control and Prevention). You can find more support from smokefree.gov and other websites. This information is not intended to replace advice given to you by your health care provider. Make sure you discuss any questions you have with your health care provider. Document Released:  07/20/2009 Document Revised: 05/21/2016 Document Reviewed: 02/07/2015 Elsevier Interactive Patient Education  2017 Elsevier Inc.  

## 2016-11-26 NOTE — Addendum Note (Signed)
Addended by: Lianne Cure A on: 11/26/2016 12:14 PM   Modules accepted: Orders
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
## 2016-11-27 ENCOUNTER — Telehealth: Payer: Self-pay | Admitting: Family

## 2016-11-27 NOTE — Telephone Encounter (Signed)
Fax received from [facility] regarding patient's upcoming appointment at their office w/ Dr.Gross on 12/09/16 at 3:15pm. He is to arrive at 2:45pm. Santiago Glad, the referral coordinator spoke w/ the patient and he is aware of the appointment. awt

## 2016-12-06 DIAGNOSIS — F329 Major depressive disorder, single episode, unspecified: Secondary | ICD-10-CM | POA: Diagnosis not present

## 2016-12-11 DIAGNOSIS — M62838 Other muscle spasm: Secondary | ICD-10-CM | POA: Diagnosis not present

## 2016-12-19 ENCOUNTER — Encounter: Payer: Self-pay | Admitting: Orthopaedic Surgery

## 2016-12-19 ENCOUNTER — Ambulatory Visit: Payer: Self-pay | Admitting: Orthopaedic Surgery

## 2017-01-08 ENCOUNTER — Encounter: Payer: Self-pay | Admitting: Orthopaedic Surgery

## 2017-01-08 ENCOUNTER — Ambulatory Visit (INDEPENDENT_AMBULATORY_CARE_PROVIDER_SITE_OTHER): Payer: Medicare Other | Admitting: Orthopaedic Surgery

## 2017-01-08 VITALS — BP 118/72 | HR 70 | Temp 98.4°F | Ht 72.0 in | Wt 179.0 lb

## 2017-01-08 DIAGNOSIS — M5442 Lumbago with sciatica, left side: Secondary | ICD-10-CM | POA: Diagnosis not present

## 2017-01-08 DIAGNOSIS — G8929 Other chronic pain: Secondary | ICD-10-CM

## 2017-01-08 DIAGNOSIS — F17210 Nicotine dependence, cigarettes, uncomplicated: Secondary | ICD-10-CM

## 2017-01-08 MED ORDER — HYDROCODONE-ACETAMINOPHEN 5-325 MG PO TABS: 1.0000 | ORAL_TABLET | Freq: Four times a day (QID) | ORAL | 0 refills | Status: DC | PRN

## 2017-01-08 NOTE — Patient Instructions (Signed)
Steps to Quit Smoking Smoking tobacco can be bad for your health. It can also affect almost every organ in your body. Smoking puts you and people around you at risk for many serious long-lasting (chronic) diseases. Quitting smoking is hard, but it is one of the best things that you can do for your health. It is never too late to quit. What are the benefits of quitting smoking? When you quit smoking, you lower your risk for getting serious diseases and conditions. They can include:  Lung cancer or lung disease.  Heart disease.  Stroke.  Heart attack.  Not being able to have children (infertility).  Weak bones (osteoporosis) and broken bones (fractures). If you have coughing, wheezing, and shortness of breath, those symptoms may get better when you quit. You may also get sick less often. If you are pregnant, quitting smoking can help to lower your chances of having a baby of low birth weight. What can I do to help me quit smoking? Talk with your doctor about what can help you quit smoking. Some things you can do (strategies) include:  Quitting smoking totally, instead of slowly cutting back how much you smoke over a period of time.  Going to in-person counseling. You are more likely to quit if you go to many counseling sessions.  Using resources and support systems, such as:  Online chats with a counselor.  Phone quitlines.  Printed self-help materials.  Support groups or group counseling.  Text messaging programs.  Mobile phone apps or applications.  Taking medicines. Some of these medicines may have nicotine in them. If you are pregnant or breastfeeding, do not take any medicines to quit smoking unless your doctor says it is okay. Talk with your doctor about counseling or other things that can help you. Talk with your doctor about using more than one strategy at the same time, such as taking medicines while you are also going to in-person counseling. This can help make quitting  easier. What things can I do to make it easier to quit? Quitting smoking might feel very hard at first, but there is a lot that you can do to make it easier. Take these steps:  Talk to your family and friends. Ask them to support and encourage you.  Call phone quitlines, reach out to support groups, or work with a counselor.  Ask people who smoke to not smoke around you.  Avoid places that make you want (trigger) to smoke, such as:  Bars.  Parties.  Smoke-break areas at work.  Spend time with people who do not smoke.  Lower the stress in your life. Stress can make you want to smoke. Try these things to help your stress:  Getting regular exercise.  Deep-breathing exercises.  Yoga.  Meditating.  Doing a body scan. To do this, close your eyes, focus on one area of your body at a time from head to toe, and notice which parts of your body are tense. Try to relax the muscles in those areas.  Download or buy apps on your mobile phone or tablet that can help you stick to your quit plan. There are many free apps, such as QuitGuide from the CDC (Centers for Disease Control and Prevention). You can find more support from smokefree.gov and other websites. This information is not intended to replace advice given to you by your health care provider. Make sure you discuss any questions you have with your health care provider. Document Released: 07/20/2009 Document Revised: 05/21/2016 Document   Reviewed: 02/07/2015 Elsevier Interactive Patient Education  2017 Elsevier Inc.  
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
## 2017-02-07 ENCOUNTER — Telehealth: Payer: Self-pay | Admitting: Orthopaedic Surgery

## 2017-02-09 ENCOUNTER — Encounter (HOSPITAL_COMMUNITY): Payer: Self-pay | Admitting: *Deleted

## 2017-02-09 ENCOUNTER — Emergency Department (HOSPITAL_COMMUNITY)
Admission: EM | Admit: 2017-02-09 | Discharge: 2017-02-09 | Disposition: A | Discharge status: Home / Self Care | Payer: Medicare Other | Attending: Emergency Medicine | Admitting: Emergency Medicine

## 2017-02-09 DIAGNOSIS — Y929 Unspecified place or not applicable: Secondary | ICD-10-CM | POA: Insufficient documentation

## 2017-02-09 DIAGNOSIS — Y9389 Activity, other specified: Secondary | ICD-10-CM | POA: Insufficient documentation

## 2017-02-09 DIAGNOSIS — S61223A Laceration with foreign body of left middle finger without damage to nail, initial encounter: Secondary | ICD-10-CM | POA: Insufficient documentation

## 2017-02-09 DIAGNOSIS — Y999 Unspecified external cause status: Secondary | ICD-10-CM | POA: Diagnosis not present

## 2017-02-09 DIAGNOSIS — W458XXA Other foreign body or object entering through skin, initial encounter: Secondary | ICD-10-CM | POA: Insufficient documentation

## 2017-02-09 DIAGNOSIS — Z1889 Other specified retained foreign body fragments: Secondary | ICD-10-CM | POA: Insufficient documentation

## 2017-02-09 DIAGNOSIS — F17210 Nicotine dependence, cigarettes, uncomplicated: Secondary | ICD-10-CM | POA: Insufficient documentation

## 2017-02-09 DIAGNOSIS — S60453A Superficial foreign body of left middle finger, initial encounter: Secondary | ICD-10-CM | POA: Diagnosis not present

## 2017-02-09 DIAGNOSIS — T148XXA Other injury of unspecified body region, initial encounter: Secondary | ICD-10-CM

## 2017-02-09 MED ORDER — LIDOCAINE HCL (PF) 2 % IJ SOLN
10.0000 mL | Freq: Once | INTRAMUSCULAR | Status: AC
  Administered 2017-02-09: 10 mL

## 2017-02-09 MED ORDER — CEPHALEXIN 500 MG PO CAPS: 500.0000 mg | ORAL_CAPSULE | Freq: Four times a day (QID) | ORAL | 0 refills | Status: DC

## 2017-02-09 MED ORDER — IBUPROFEN 600 MG PO TABS: 600.0000 mg | ORAL_TABLET | Freq: Four times a day (QID) | ORAL | 0 refills | Status: DC | PRN

## 2017-02-09 MED ORDER — CEPHALEXIN 500 MG PO CAPS
500.0000 mg | ORAL_CAPSULE | Freq: Once | ORAL | Status: AC
  Administered 2017-02-09: 500 mg via ORAL
  Filled 2017-02-09: qty 1

## 2017-02-09 MED ORDER — LIDOCAINE HCL (PF) 2 % IJ SOLN
INTRAMUSCULAR | Status: AC
  Administered 2017-02-09: 10 mL
  Filled 2017-02-09: qty 10

## 2017-02-09 NOTE — ED Notes (Signed)
Pt states understanding of care given and follow up instructions.  Pt a/o, ambulated from ED with steady gait  

## 2017-02-09 NOTE — ED Notes (Signed)
Pt not in the waiting area x 1.

## 2017-02-09 NOTE — Discharge Instructions (Signed)
Take your entire course of the antibiotics prescribed.  You may use ibuprofen, ice and elevation will also help with pain.  Use mild soap and water twice daily to wash your wound.

## 2017-02-09 NOTE — ED Triage Notes (Signed)
Pt states he has a piece of wood in the base of his left pointer finger.

## 2017-02-10 ENCOUNTER — Telehealth: Payer: Self-pay | Admitting: Orthopaedic Surgery

## 2017-02-10 NOTE — Telephone Encounter (Signed)
Patient requests refill on Hydrocodone/Acetaminophen 5-325 mgs.   Qty  60  Sig: Take 1 tablet by mouth every 6 (six) hours as needed for moderate pain (Must last 30 days.Do not take and drive a car or use machinery.).
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
## 2017-02-20 ENCOUNTER — Other Ambulatory Visit: Payer: Self-pay

## 2017-02-20 DIAGNOSIS — I739 Peripheral vascular disease, unspecified: Secondary | ICD-10-CM

## 2017-02-21 ENCOUNTER — Encounter: Payer: Self-pay | Admitting: Family

## 2017-02-27 ENCOUNTER — Ambulatory Visit (HOSPITAL_COMMUNITY): Payer: Medicare Other | Attending: Family

## 2017-02-27 ENCOUNTER — Ambulatory Visit: Payer: Medicare Other | Admitting: Family

## 2017-03-12 ENCOUNTER — Telehealth: Payer: Self-pay | Admitting: Orthopaedic Surgery

## 2017-04-01 ENCOUNTER — Ambulatory Visit: Payer: Self-pay | Admitting: Orthopaedic Surgery

## 2017-04-08 ENCOUNTER — Ambulatory Visit: Payer: Self-pay | Admitting: Orthopaedic Surgery

## 2017-05-11 ENCOUNTER — Emergency Department (HOSPITAL_COMMUNITY)
Admission: EM | Admit: 2017-05-11 | Discharge: 2017-05-12 | Disposition: A | Discharge status: Home / Self Care | Payer: Medicare Other | Attending: Emergency Medicine | Admitting: Emergency Medicine

## 2017-05-11 ENCOUNTER — Encounter (HOSPITAL_COMMUNITY): Payer: Self-pay | Admitting: Emergency Medicine

## 2017-05-11 DIAGNOSIS — R45851 Suicidal ideations: Secondary | ICD-10-CM | POA: Insufficient documentation

## 2017-05-11 DIAGNOSIS — Z79899 Other long term (current) drug therapy: Secondary | ICD-10-CM | POA: Diagnosis not present

## 2017-05-11 DIAGNOSIS — F329 Major depressive disorder, single episode, unspecified: Secondary | ICD-10-CM | POA: Diagnosis present

## 2017-05-11 DIAGNOSIS — Z9114 Patient's other noncompliance with medication regimen: Secondary | ICD-10-CM | POA: Insufficient documentation

## 2017-05-11 DIAGNOSIS — F332 Major depressive disorder, recurrent severe without psychotic features: Secondary | ICD-10-CM | POA: Diagnosis not present

## 2017-05-11 DIAGNOSIS — F17210 Nicotine dependence, cigarettes, uncomplicated: Secondary | ICD-10-CM | POA: Insufficient documentation

## 2017-05-11 DIAGNOSIS — Z046 Encounter for general psychiatric examination, requested by authority: Secondary | ICD-10-CM | POA: Insufficient documentation

## 2017-05-11 DIAGNOSIS — F1910 Other psychoactive substance abuse, uncomplicated: Secondary | ICD-10-CM | POA: Diagnosis not present

## 2017-05-11 DIAGNOSIS — F1410 Cocaine abuse, uncomplicated: Secondary | ICD-10-CM | POA: Insufficient documentation

## 2017-05-11 DIAGNOSIS — Z7982 Long term (current) use of aspirin: Secondary | ICD-10-CM | POA: Insufficient documentation

## 2017-05-11 DIAGNOSIS — F32A Depression, unspecified: Secondary | ICD-10-CM

## 2017-05-11 DIAGNOSIS — F1490 Cocaine use, unspecified, uncomplicated: Secondary | ICD-10-CM | POA: Diagnosis not present

## 2017-05-11 LAB — COMPREHENSIVE METABOLIC PANEL
ALBUMIN: 4.6 g/dL (ref 3.5–5.0)
ALK PHOS: 51 U/L (ref 38–126)
ALT: 26 U/L (ref 17–63)
ANION GAP: 10 (ref 5–15)
AST: 24 U/L (ref 15–41)
BILIRUBIN TOTAL: 0.7 mg/dL (ref 0.3–1.2)
BUN: 14 mg/dL (ref 6–20)
CALCIUM: 10.3 mg/dL (ref 8.9–10.3)
CO2: 28 mmol/L (ref 22–32)
Chloride: 104 mmol/L (ref 101–111)
Creatinine, Ser: 1.68 mg/dL — ABNORMAL HIGH (ref 0.61–1.24)
GFR calc Af Amer: 50 mL/min — ABNORMAL LOW (ref >60)
GFR calc non Af Amer: 43 mL/min — ABNORMAL LOW (ref >60)
GLUCOSE: 111 mg/dL — AB (ref 65–99)
Potassium: 4.5 mmol/L (ref 3.5–5.1)
Sodium: 142 mmol/L (ref 135–145)
Total Protein: 8.7 g/dL — ABNORMAL HIGH (ref 6.5–8.1)

## 2017-05-11 LAB — CBC
HEMATOCRIT: 48 % (ref 39.0–52.0)
HEMOGLOBIN: 16 g/dL (ref 13.0–17.0)
MCH: 29.3 pg (ref 26.0–34.0)
MCHC: 33.3 g/dL (ref 30.0–36.0)
MCV: 87.9 fL (ref 78.0–100.0)
Platelets: 174 10*3/uL (ref 150–400)
RBC: 5.46 MIL/uL (ref 4.22–5.81)
RDW: 14.6 % (ref 11.5–15.5)
WBC: 13 10*3/uL — ABNORMAL HIGH (ref 4.0–10.5)

## 2017-05-11 LAB — ETHANOL: Alcohol, Ethyl (B): <5 mg/dL (ref <5)

## 2017-05-11 LAB — ACETAMINOPHEN LEVEL

## 2017-05-11 LAB — SALICYLATE LEVEL: Salicylate Lvl: <7 mg/dL (ref 2.8–30.0)
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
## 2017-05-11 NOTE — ED Triage Notes (Signed)
Patient states "I need some professional help. I just want to end it all. I'm tired of living like this." States he uses cocaine and last use was 1 hour ago. Denies specific plan.

## 2017-05-11 NOTE — ED Notes (Signed)
Patient request to be confidential while patient here. Advised registration.

## 2017-05-12 DIAGNOSIS — F1910 Other psychoactive substance abuse, uncomplicated: Secondary | ICD-10-CM | POA: Diagnosis not present

## 2017-05-12 DIAGNOSIS — F1490 Cocaine use, unspecified, uncomplicated: Secondary | ICD-10-CM

## 2017-05-12 DIAGNOSIS — F329 Major depressive disorder, single episode, unspecified: Secondary | ICD-10-CM | POA: Diagnosis not present

## 2017-05-12 DIAGNOSIS — F17210 Nicotine dependence, cigarettes, uncomplicated: Secondary | ICD-10-CM

## 2017-05-12 DIAGNOSIS — F332 Major depressive disorder, recurrent severe without psychotic features: Secondary | ICD-10-CM | POA: Diagnosis not present

## 2017-05-12 NOTE — ED Notes (Signed)
Dr. Gilford Raid aware that pt is requesting to speak to her.

## 2017-05-12 NOTE — ED Notes (Signed)
Meal given
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
## 2017-05-12 NOTE — ED Notes (Addendum)
Pt sitter given new scrubs, socks, washcloths, towels, soap, toothbrush/paste for shower.  Security called.   Pt also stating "i wanna leave and get out of here, I checked myself in and I can check myself out." Dr. Gilford Raid informed.

## 2017-05-12 NOTE — ED Notes (Signed)
Pt offered shower, did not want one at this time. Pt states he would like one later.

## 2017-05-12 NOTE — ED Provider Notes (Signed)
Pt reassessed by TTS.  He is cleared for discharge.  He told the NP that he was not suicidal.  I spoke with him as well, and he denies si.  He said he gets down when he uses cocaine, but once it is out of his system, he is ok.  He has outpatient f/u with [facility] at [place].  He knows to return if worse.   Isla Pence, MD 05/12/17 1438

## 2017-05-12 NOTE — Progress Notes (Signed)
Per Hannah Beat, patient does not meet criteria for inpatient treatment. Patient is recommended for discharge with outpatient reousrces.   CSW referred patient to [facility] in [place].   -  Medication management appointment - 05/21/17 with Dr. Irine Seal at [facility] appointment - 05/29/17 with Glori Bickers 10:00am.   Heywood Iles, RN notified. Agreed to notify patient's RN, Eston Esters, RN.   Radonna Ricker MSW, [place] Disposition [phone]
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
## 2017-05-13 ENCOUNTER — Encounter (HOSPITAL_COMMUNITY): Payer: Self-pay | Admitting: *Deleted

## 2017-05-13 DIAGNOSIS — F1410 Cocaine abuse, uncomplicated: Secondary | ICD-10-CM | POA: Diagnosis present

## 2017-05-13 DIAGNOSIS — F17210 Nicotine dependence, cigarettes, uncomplicated: Secondary | ICD-10-CM | POA: Diagnosis not present

## 2017-05-13 DIAGNOSIS — R45851 Suicidal ideations: Secondary | ICD-10-CM | POA: Diagnosis not present

## 2017-05-13 DIAGNOSIS — F329 Major depressive disorder, single episode, unspecified: Secondary | ICD-10-CM | POA: Insufficient documentation

## 2017-05-13 LAB — CBC
HCT: 46.5 % (ref 39.0–52.0)
HEMOGLOBIN: 15.7 g/dL (ref 13.0–17.0)
MCH: 29.4 pg (ref 26.0–34.0)
MCHC: 33.8 g/dL (ref 30.0–36.0)
MCV: 87.1 fL (ref 78.0–100.0)
Platelets: 174 10*3/uL (ref 150–400)
RBC: 5.34 MIL/uL (ref 4.22–5.81)
RDW: 14.9 % (ref 11.5–15.5)
WBC: 13.6 10*3/uL — ABNORMAL HIGH (ref 4.0–10.5)

## 2017-05-13 LAB — COMPREHENSIVE METABOLIC PANEL
ALK PHOS: 47 U/L (ref 38–126)
ALT: 25 U/L (ref 17–63)
AST: 28 U/L (ref 15–41)
Albumin: 4.3 g/dL (ref 3.5–5.0)
Anion gap: 10 (ref 5–15)
BILIRUBIN TOTAL: 0.6 mg/dL (ref 0.3–1.2)
BUN: 12 mg/dL (ref 6–20)
CALCIUM: 9.5 mg/dL (ref 8.9–10.3)
CO2: 26 mmol/L (ref 22–32)
CREATININE: 1.57 mg/dL — AB (ref 0.61–1.24)
Chloride: 101 mmol/L (ref 101–111)
GFR, EST AFRICAN AMERICAN: 54 mL/min — AB (ref >60)
GFR, EST NON AFRICAN AMERICAN: 47 mL/min — AB (ref >60)
Glucose, Bld: 102 mg/dL — ABNORMAL HIGH (ref 65–99)
Potassium: 3.8 mmol/L (ref 3.5–5.1)
Sodium: 137 mmol/L (ref 135–145)
Total Protein: 7.5 g/dL (ref 6.5–8.1)

## 2017-05-13 LAB — RAPID URINE DRUG SCREEN, HOSP PERFORMED
Amphetamines: NOT DETECTED
BARBITURATES: NOT DETECTED
Benzodiazepines: NOT DETECTED
Cocaine: POSITIVE — AB
OPIATES: NOT DETECTED
TETRAHYDROCANNABINOL: NOT DETECTED

## 2017-05-13 LAB — ETHANOL

## 2017-05-13 LAB — SALICYLATE LEVEL

## 2017-05-13 LAB — ACETAMINOPHEN LEVEL: Acetaminophen (Tylenol), Serum: <10 ug/mL — ABNORMAL LOW (ref 10–30)

## 2017-05-13 NOTE — ED Triage Notes (Signed)
Pt has been having suicidal ideations for over a week; hx of depression, has not been taking medications "for quite some time" When asked if anything new has occurred to bring on suicidal thoughts, pt states "let's just say if you lived the life I do you would feel the same way." Pt denies plan  Last cocaine use 3 hours ago

## 2017-05-13 NOTE — ED Notes (Signed)
Pt given burgundy scrubs to change into; staffing office contacted for sitter

## 2017-05-14 ENCOUNTER — Encounter (HOSPITAL_COMMUNITY): Payer: Self-pay | Admitting: Behavioral Health

## 2017-05-14 ENCOUNTER — Emergency Department (HOSPITAL_COMMUNITY)
Admission: EM | Admit: 2017-05-14 | Discharge: 2017-05-14 | Disposition: A | Discharge status: Home / Self Care | Payer: Medicare Other | Attending: Emergency Medicine | Admitting: Emergency Medicine

## 2017-05-14 DIAGNOSIS — F1410 Cocaine abuse, uncomplicated: Secondary | ICD-10-CM

## 2017-05-14 DIAGNOSIS — R45851 Suicidal ideations: Secondary | ICD-10-CM

## 2017-05-14 MED ORDER — LORAZEPAM 1 MG PO TABS
1.0000 mg | ORAL_TABLET | Freq: Once | ORAL | Status: AC
  Administered 2017-05-14: 1 mg via ORAL
  Filled 2017-05-14: qty 1

## 2017-05-14 NOTE — ED Notes (Signed)
Sandwich and coke given

## 2017-05-14 NOTE — Progress Notes (Signed)
Per Hughie Closs, NP, patient does not meet criteria for inpatient treatment. Patient is recommended for discharge and to follow up with outpatient providers.    Arlice Colt, RN notified.   Radonna Ricker MSW, [place] Disposition [phone]
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
## 2017-05-14 NOTE — ED Notes (Signed)
Pt signed no harm contract. Pt also calling family to come and pick him up.
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
## 2017-05-14 NOTE — ED Notes (Addendum)
Pt discharged home with family member  They brought him clothes  He had no clothes here.  He had no personal belongings  Discharge papers given to him he signed  Follow up with [facility] and behavorial
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
## 2017-05-14 NOTE — ED Notes (Addendum)
Pt waiting for family to come and get him  They are bringing clothes for him

## 2017-05-14 NOTE — ED Notes (Signed)
Pt complaining about wait time; offered antianxiety; verbal orders obtained by Dr.Ward
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
## 2017-05-21 ENCOUNTER — Other Ambulatory Visit (HOSPITAL_COMMUNITY): Payer: Self-pay | Admitting: Internal Medicine

## 2017-05-21 ENCOUNTER — Ambulatory Visit (HOSPITAL_COMMUNITY): Payer: Medicare Other | Admitting: Psychiatry

## 2017-05-21 DIAGNOSIS — F17200 Nicotine dependence, unspecified, uncomplicated: Secondary | ICD-10-CM | POA: Diagnosis not present

## 2017-05-21 DIAGNOSIS — K219 Gastro-esophageal reflux disease without esophagitis: Secondary | ICD-10-CM | POA: Diagnosis not present

## 2017-05-21 DIAGNOSIS — N183 Chronic kidney disease, stage 3 unspecified: Secondary | ICD-10-CM

## 2017-05-21 DIAGNOSIS — M5489 Other dorsalgia: Secondary | ICD-10-CM | POA: Diagnosis not present

## 2017-05-21 DIAGNOSIS — I10 Essential (primary) hypertension: Secondary | ICD-10-CM | POA: Diagnosis not present

## 2017-05-21 DIAGNOSIS — Z0000 Encounter for general adult medical examination without abnormal findings: Secondary | ICD-10-CM | POA: Diagnosis not present

## 2017-05-21 DIAGNOSIS — F329 Major depressive disorder, single episode, unspecified: Secondary | ICD-10-CM | POA: Diagnosis not present

## 2017-05-21 DIAGNOSIS — M25551 Pain in right hip: Secondary | ICD-10-CM | POA: Diagnosis not present

## 2017-05-28 ENCOUNTER — Encounter (HOSPITAL_COMMUNITY): Payer: Self-pay

## 2017-05-28 ENCOUNTER — Ambulatory Visit (HOSPITAL_COMMUNITY): Payer: Medicare Other

## 2017-05-29 ENCOUNTER — Ambulatory Visit (HOSPITAL_COMMUNITY): Payer: Medicare Other | Admitting: Licensed Clinical Social Worker
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
## 2017-06-23 ENCOUNTER — Ambulatory Visit (HOSPITAL_COMMUNITY): Payer: Medicare Other | Admitting: Psychiatry

## 2017-06-26 DIAGNOSIS — Z23 Encounter for immunization: Secondary | ICD-10-CM | POA: Diagnosis not present

## 2017-06-26 DIAGNOSIS — F17200 Nicotine dependence, unspecified, uncomplicated: Secondary | ICD-10-CM | POA: Diagnosis not present

## 2017-06-26 DIAGNOSIS — K219 Gastro-esophageal reflux disease without esophagitis: Secondary | ICD-10-CM | POA: Diagnosis not present

## 2017-06-26 DIAGNOSIS — I10 Essential (primary) hypertension: Secondary | ICD-10-CM | POA: Diagnosis not present

## 2017-06-26 DIAGNOSIS — F329 Major depressive disorder, single episode, unspecified: Secondary | ICD-10-CM | POA: Diagnosis not present

## 2017-07-22 ENCOUNTER — Encounter (HOSPITAL_COMMUNITY): Payer: Self-pay | Admitting: *Deleted

## 2017-07-22 DIAGNOSIS — S29011A Strain of muscle and tendon of front wall of thorax, initial encounter: Secondary | ICD-10-CM | POA: Insufficient documentation

## 2017-07-22 DIAGNOSIS — R0602 Shortness of breath: Secondary | ICD-10-CM | POA: Diagnosis not present

## 2017-07-22 DIAGNOSIS — Y9389 Activity, other specified: Secondary | ICD-10-CM | POA: Diagnosis not present

## 2017-07-22 DIAGNOSIS — Y999 Unspecified external cause status: Secondary | ICD-10-CM | POA: Diagnosis not present

## 2017-07-22 DIAGNOSIS — X500XXA Overexertion from strenuous movement or load, initial encounter: Secondary | ICD-10-CM | POA: Diagnosis not present

## 2017-07-22 DIAGNOSIS — R109 Unspecified abdominal pain: Secondary | ICD-10-CM | POA: Diagnosis not present

## 2017-07-22 DIAGNOSIS — J40 Bronchitis, not specified as acute or chronic: Secondary | ICD-10-CM | POA: Insufficient documentation

## 2017-07-22 DIAGNOSIS — Y929 Unspecified place or not applicable: Secondary | ICD-10-CM | POA: Insufficient documentation

## 2017-07-22 DIAGNOSIS — F17210 Nicotine dependence, cigarettes, uncomplicated: Secondary | ICD-10-CM | POA: Insufficient documentation

## 2017-07-22 DIAGNOSIS — S299XXA Unspecified injury of thorax, initial encounter: Secondary | ICD-10-CM | POA: Diagnosis present

## 2017-07-22 NOTE — ED Triage Notes (Signed)
Pt c/o left side flank pain that started this evening at around 8pm; pt states he is unable to rest due to the pain

## 2017-07-23 ENCOUNTER — Emergency Department (HOSPITAL_COMMUNITY): Payer: Medicare Other

## 2017-07-23 ENCOUNTER — Emergency Department (HOSPITAL_COMMUNITY)
Admission: EM | Admit: 2017-07-23 | Discharge: 2017-07-23 | Disposition: A | Discharge status: Home / Self Care | Payer: Medicare Other | Attending: Emergency Medicine | Admitting: Emergency Medicine

## 2017-07-23 DIAGNOSIS — R109 Unspecified abdominal pain: Secondary | ICD-10-CM | POA: Diagnosis not present

## 2017-07-23 DIAGNOSIS — J40 Bronchitis, not specified as acute or chronic: Secondary | ICD-10-CM

## 2017-07-23 DIAGNOSIS — S29011A Strain of muscle and tendon of front wall of thorax, initial encounter: Secondary | ICD-10-CM | POA: Diagnosis not present

## 2017-07-23 LAB — URINALYSIS, ROUTINE W REFLEX MICROSCOPIC
Bacteria, UA: NONE SEEN
Bilirubin Urine: NEGATIVE
GLUCOSE, UA: NEGATIVE mg/dL
Hgb urine dipstick: NEGATIVE
Ketones, ur: NEGATIVE mg/dL
Leukocytes, UA: NEGATIVE
Nitrite: NEGATIVE
PROTEIN: 30 mg/dL — AB
Specific Gravity, Urine: 1.026 (ref 1.005–1.030)
Squamous Epithelial / LPF: NONE SEEN
pH: 5 (ref 5.0–8.0)

## 2017-07-23 LAB — BASIC METABOLIC PANEL
ANION GAP: 10 (ref 5–15)
BUN: 12 mg/dL (ref 6–20)
CO2: 27 mmol/L (ref 22–32)
Calcium: 9.3 mg/dL (ref 8.9–10.3)
Chloride: 101 mmol/L (ref 101–111)
Creatinine, Ser: 1.11 mg/dL (ref 0.61–1.24)
GFR calc non Af Amer: >60 mL/min (ref >60)
GLUCOSE: 95 mg/dL (ref 65–99)
POTASSIUM: 3.6 mmol/L (ref 3.5–5.1)
SODIUM: 138 mmol/L (ref 135–145)

## 2017-07-23 LAB — CBC WITH DIFFERENTIAL/PLATELET
Basophils Absolute: 0.1 10*3/uL (ref 0.0–0.1)
Basophils Relative: 1 %
Eosinophils Absolute: 0.6 10*3/uL (ref 0.0–0.7)
Eosinophils Relative: 7 %
HEMATOCRIT: 48.1 % (ref 39.0–52.0)
HEMOGLOBIN: 15.8 g/dL (ref 13.0–17.0)
LYMPHS PCT: 30 %
Lymphs Abs: 2.6 10*3/uL (ref 0.7–4.0)
MCH: 29.3 pg (ref 26.0–34.0)
MCHC: 32.8 g/dL (ref 30.0–36.0)
MCV: 89.2 fL (ref 78.0–100.0)
Monocytes Absolute: 0.6 10*3/uL (ref 0.1–1.0)
Monocytes Relative: 7 %
NEUTROS ABS: 4.7 10*3/uL (ref 1.7–7.7)
NEUTROS PCT: 55 %
Platelets: 184 10*3/uL (ref 150–400)
RBC: 5.39 MIL/uL (ref 4.22–5.81)
RDW: 13.4 % (ref 11.5–15.5)
WBC: 8.6 10*3/uL (ref 4.0–10.5)

## 2017-07-23 MED ORDER — IPRATROPIUM-ALBUTEROL 0.5-2.5 (3) MG/3ML IN SOLN
3.0000 mL | Freq: Once | RESPIRATORY_TRACT | Status: AC
  Administered 2017-07-23: 3 mL via RESPIRATORY_TRACT
  Filled 2017-07-23: qty 3

## 2017-07-23 MED ORDER — FENTANYL CITRATE (PF) 100 MCG/2ML IJ SOLN
50.0000 ug | Freq: Once | INTRAMUSCULAR | Status: AC
  Administered 2017-07-23: 50 ug via INTRAVENOUS
  Filled 2017-07-23: qty 2

## 2017-07-23 MED ORDER — ALBUTEROL SULFATE HFA 108 (90 BASE) MCG/ACT IN AERS
2.0000 | INHALATION_SPRAY | Freq: Once | RESPIRATORY_TRACT | Status: AC
  Administered 2017-07-23: 2 via RESPIRATORY_TRACT
  Filled 2017-07-23: qty 6.7

## 2017-07-23 NOTE — ED Notes (Signed)
Pt alert & oriented x4, stable gait. Patient given discharge instructions, paperwork & prescription(s). Patient  instructed to stop at the registration desk to finish any additional paperwork. Patient verbalized understanding. Pt left department w/ no further questions. 

## 2017-07-23 NOTE — ED Notes (Signed)
Pt states left flank pain that started about 4 hours ago. Described as a sharp pain.
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
## 2017-11-13 DIAGNOSIS — F17200 Nicotine dependence, unspecified, uncomplicated: Secondary | ICD-10-CM | POA: Diagnosis not present

## 2017-11-13 DIAGNOSIS — I10 Essential (primary) hypertension: Secondary | ICD-10-CM | POA: Diagnosis not present

## 2017-11-13 DIAGNOSIS — M545 Low back pain: Secondary | ICD-10-CM | POA: Diagnosis not present

## 2017-11-13 DIAGNOSIS — G8929 Other chronic pain: Secondary | ICD-10-CM | POA: Diagnosis not present

## 2017-11-14 DIAGNOSIS — R7611 Nonspecific reaction to tuberculin skin test without active tuberculosis: Secondary | ICD-10-CM | POA: Diagnosis not present

## 2017-11-14 DIAGNOSIS — F17200 Nicotine dependence, unspecified, uncomplicated: Secondary | ICD-10-CM | POA: Diagnosis not present

## 2017-11-14 DIAGNOSIS — M545 Low back pain: Secondary | ICD-10-CM | POA: Diagnosis not present

## 2017-11-14 DIAGNOSIS — G8929 Other chronic pain: Secondary | ICD-10-CM | POA: Diagnosis not present

## 2017-11-17 DIAGNOSIS — R7611 Nonspecific reaction to tuberculin skin test without active tuberculosis: Secondary | ICD-10-CM | POA: Diagnosis not present

## 2017-11-21 DIAGNOSIS — J309 Allergic rhinitis, unspecified: Secondary | ICD-10-CM | POA: Diagnosis not present

## 2017-11-27 DIAGNOSIS — M542 Cervicalgia: Secondary | ICD-10-CM | POA: Diagnosis not present

## 2017-11-28 DIAGNOSIS — M542 Cervicalgia: Secondary | ICD-10-CM | POA: Diagnosis not present

## 2018-02-16 DIAGNOSIS — K219 Gastro-esophageal reflux disease without esophagitis: Secondary | ICD-10-CM | POA: Diagnosis not present

## 2018-02-16 DIAGNOSIS — F17200 Nicotine dependence, unspecified, uncomplicated: Secondary | ICD-10-CM | POA: Diagnosis not present

## 2018-02-16 DIAGNOSIS — I10 Essential (primary) hypertension: Secondary | ICD-10-CM | POA: Diagnosis not present

## 2018-06-02 ENCOUNTER — Encounter (HOSPITAL_COMMUNITY): Payer: Self-pay | Admitting: *Deleted

## 2018-06-02 ENCOUNTER — Other Ambulatory Visit: Payer: Self-pay

## 2018-06-02 ENCOUNTER — Emergency Department (HOSPITAL_COMMUNITY)
Admission: EM | Admit: 2018-06-02 | Discharge: 2018-06-03 | Disposition: A | Discharge status: Home / Self Care | Payer: Medicare Other | Attending: Emergency Medicine | Admitting: Emergency Medicine

## 2018-06-02 DIAGNOSIS — F1910 Other psychoactive substance abuse, uncomplicated: Secondary | ICD-10-CM

## 2018-06-02 DIAGNOSIS — R45851 Suicidal ideations: Secondary | ICD-10-CM | POA: Diagnosis not present

## 2018-06-02 DIAGNOSIS — F332 Major depressive disorder, recurrent severe without psychotic features: Secondary | ICD-10-CM | POA: Diagnosis not present

## 2018-06-02 DIAGNOSIS — F1420 Cocaine dependence, uncomplicated: Secondary | ICD-10-CM | POA: Insufficient documentation

## 2018-06-02 DIAGNOSIS — F17210 Nicotine dependence, cigarettes, uncomplicated: Secondary | ICD-10-CM | POA: Insufficient documentation

## 2018-06-02 DIAGNOSIS — F329 Major depressive disorder, single episode, unspecified: Secondary | ICD-10-CM | POA: Diagnosis present

## 2018-06-02 LAB — RAPID URINE DRUG SCREEN, HOSP PERFORMED
AMPHETAMINES: NOT DETECTED
BARBITURATES: NOT DETECTED
BENZODIAZEPINES: NOT DETECTED
Cocaine: POSITIVE — AB
Opiates: NOT DETECTED
Tetrahydrocannabinol: POSITIVE — AB

## 2018-06-02 LAB — CBC
HEMATOCRIT: 46 % (ref 39.0–52.0)
Hemoglobin: 15.8 g/dL (ref 13.0–17.0)
MCH: 30.9 pg (ref 26.0–34.0)
MCHC: 34.3 g/dL (ref 30.0–36.0)
MCV: 89.8 fL (ref 78.0–100.0)
PLATELETS: 177 10*3/uL (ref 150–400)
RBC: 5.12 MIL/uL (ref 4.22–5.81)
RDW: 12.7 % (ref 11.5–15.5)
WBC: 14.5 10*3/uL — AB (ref 4.0–10.5)

## 2018-06-02 LAB — COMPREHENSIVE METABOLIC PANEL
ALBUMIN: 4.1 g/dL (ref 3.5–5.0)
ALT: 15 U/L (ref 0–44)
AST: 18 U/L (ref 15–41)
Alkaline Phosphatase: 40 U/L (ref 38–126)
Anion gap: 6 (ref 5–15)
BILIRUBIN TOTAL: 0.4 mg/dL (ref 0.3–1.2)
BUN: 10 mg/dL (ref 6–20)
CO2: 27 mmol/L (ref 22–32)
CREATININE: 1.08 mg/dL (ref 0.61–1.24)
Calcium: 9.4 mg/dL (ref 8.9–10.3)
Chloride: 104 mmol/L (ref 98–111)
GFR calc Af Amer: >60 mL/min (ref >60)
GLUCOSE: 106 mg/dL — AB (ref 70–99)
Potassium: 4.1 mmol/L (ref 3.5–5.1)
Sodium: 137 mmol/L (ref 135–145)
TOTAL PROTEIN: 7.6 g/dL (ref 6.5–8.1)

## 2018-06-02 LAB — SALICYLATE LEVEL: Salicylate Lvl: <7 mg/dL (ref 2.8–30.0)

## 2018-06-02 LAB — ETHANOL

## 2018-06-02 LAB — ACETAMINOPHEN LEVEL: Acetaminophen (Tylenol), Serum: <10 ug/mL — ABNORMAL LOW (ref 10–30)

## 2018-06-02 MED ORDER — ZOLPIDEM TARTRATE 5 MG PO TABS: 5.0000 mg | ORAL_TABLET | Freq: Every evening | ORAL | Status: DC | PRN

## 2018-06-02 MED ORDER — ACETAMINOPHEN 325 MG PO TABS: 650.0000 mg | ORAL_TABLET | ORAL | Status: DC | PRN
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
## 2018-06-02 NOTE — ED Triage Notes (Signed)
Pt c/o depression and that he does not have a reason to be here anymore.  Lives with son and his wife.  Pt with SI and denies any plan, denies HI.  Drinks etoh and denies use today.  Admits to cocaine and used at 1500 today per pt.

## 2018-06-03 ENCOUNTER — Encounter (HOSPITAL_COMMUNITY): Payer: Self-pay | Admitting: Registered Nurse

## 2018-06-03 DIAGNOSIS — F332 Major depressive disorder, recurrent severe without psychotic features: Secondary | ICD-10-CM | POA: Diagnosis not present
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
## 2018-06-03 NOTE — Progress Notes (Signed)
Disposition CSW included a recommendation to contact [facility] in D/C Instructions and faxed over additional short-term and long-term resources,  Romie Minus T. Judi Cong, MSW, [place] Disposition Clinical Social Work [phone] (cell) [phone] (office)

## 2018-06-03 NOTE — ED Notes (Signed)
Pt given belongings back at this time  
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
## 2018-06-03 NOTE — Consult Note (Signed)
Tele psych Assessment   Gary [name], 62 y.o., male patient seen via telepsych by TTS and  this provider; chart reviewed and consulted with Dr. Dwyane Dee on 06/03/18.  On evaluation Gary [name] reports that he is feeling better this morning.  Patient denies suicidal/self-harm/homicidal ideation, psychosis, and paranoia.  Patient states that he is interested in outpatient psychiatric services and rehab services.   During evaluation Gary [name] is alert/oriented x 4; calm/cooperative; and mood is congruent with affect.  He does not appear to be responding to internal/external stimuli or delusional thoughts.  Patient denies suicidal/self-harm/homicidal ideation, psychosis, and paranoia.  Patient answered question appropriately.       For detailed note see TTS tele assessment note  Recommendations: Referral/resources for outpatient psychiatric services and short/long term rehab facilities  Disposition:  Patient psychiatrically cleared No evidence of imminent risk to self or others at present.   Patient does not meet criteria for psychiatric inpatient admission. Supportive therapy provided about ongoing stressors. Discussed crisis plan, support from social network, calling 911, coming to the Emergency Department, and calling Suicide Hotline.  Spoke with Dr. Dayna Barker; Informed of above recommendation and disposition  Kynnedi Zweig B. Vanda Waskey, NP

## 2018-06-12 ENCOUNTER — Emergency Department (HOSPITAL_COMMUNITY): Payer: Medicare HMO

## 2018-06-12 ENCOUNTER — Emergency Department (HOSPITAL_COMMUNITY)
Admission: EM | Admit: 2018-06-12 | Discharge: 2018-06-12 | Disposition: A | Discharge status: Home / Self Care | Payer: Medicare HMO | Attending: Emergency Medicine | Admitting: Emergency Medicine

## 2018-06-12 ENCOUNTER — Encounter (HOSPITAL_COMMUNITY): Payer: Self-pay | Admitting: Emergency Medicine

## 2018-06-12 ENCOUNTER — Other Ambulatory Visit: Payer: Self-pay

## 2018-06-12 DIAGNOSIS — S61411A Laceration without foreign body of right hand, initial encounter: Secondary | ICD-10-CM | POA: Diagnosis not present

## 2018-06-12 DIAGNOSIS — Z23 Encounter for immunization: Secondary | ICD-10-CM | POA: Insufficient documentation

## 2018-06-12 DIAGNOSIS — Y999 Unspecified external cause status: Secondary | ICD-10-CM | POA: Insufficient documentation

## 2018-06-12 DIAGNOSIS — F17210 Nicotine dependence, cigarettes, uncomplicated: Secondary | ICD-10-CM | POA: Insufficient documentation

## 2018-06-12 DIAGNOSIS — Y939 Activity, unspecified: Secondary | ICD-10-CM | POA: Insufficient documentation

## 2018-06-12 DIAGNOSIS — W450XXA Nail entering through skin, initial encounter: Secondary | ICD-10-CM | POA: Diagnosis not present

## 2018-06-12 DIAGNOSIS — Y929 Unspecified place or not applicable: Secondary | ICD-10-CM | POA: Diagnosis not present

## 2018-06-12 DIAGNOSIS — F1410 Cocaine abuse, uncomplicated: Secondary | ICD-10-CM | POA: Insufficient documentation

## 2018-06-12 HISTORY — DX: Cocaine abuse, uncomplicated: F14.10

## 2018-06-12 MED ORDER — TETANUS-DIPHTH-ACELL PERTUSSIS 5-2.5-18.5 LF-MCG/0.5 IM SUSP
0.5000 mL | Freq: Once | INTRAMUSCULAR | Status: AC
  Administered 2018-06-12: 0.5 mL via INTRAMUSCULAR
  Filled 2018-06-12: qty 0.5

## 2018-06-12 MED ORDER — BACITRACIN ZINC 500 UNIT/GM EX OINT
1.0000 "application " | TOPICAL_OINTMENT | Freq: Two times a day (BID) | CUTANEOUS | Status: DC
  Administered 2018-06-12 (×2): 1 via TOPICAL
  Filled 2018-06-12: qty 0.9

## 2018-06-12 NOTE — ED Notes (Signed)
Pt reports he is trying to get into rehab and desires an injury to his hand be evaluated  Wound appears to be a well healing flap lac to his R palmar surface   There is a large scab between the flap and palm  There is no redness, swelling or loss of motion noted

## 2018-06-12 NOTE — Discharge Instructions (Addendum)
Clean the area with mild soap and water and keep it bandaged until it begins to heal.  Your tetanus vaccine was updated today.  Return to ER for any signs of infection develop such as surrounding redness, drainage or increasing pain.
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
## 2018-06-22 DIAGNOSIS — K219 Gastro-esophageal reflux disease without esophagitis: Secondary | ICD-10-CM | POA: Diagnosis not present

## 2018-06-22 DIAGNOSIS — M5489 Other dorsalgia: Secondary | ICD-10-CM | POA: Diagnosis not present

## 2018-06-22 DIAGNOSIS — G478 Other sleep disorders: Secondary | ICD-10-CM | POA: Diagnosis not present

## 2018-06-22 DIAGNOSIS — Z1389 Encounter for screening for other disorder: Secondary | ICD-10-CM | POA: Diagnosis not present

## 2018-06-22 DIAGNOSIS — J410 Simple chronic bronchitis: Secondary | ICD-10-CM | POA: Diagnosis not present

## 2018-06-22 DIAGNOSIS — M25551 Pain in right hip: Secondary | ICD-10-CM | POA: Diagnosis not present

## 2018-06-22 DIAGNOSIS — Z23 Encounter for immunization: Secondary | ICD-10-CM | POA: Diagnosis not present

## 2018-06-22 DIAGNOSIS — F17200 Nicotine dependence, unspecified, uncomplicated: Secondary | ICD-10-CM | POA: Diagnosis not present

## 2018-06-22 DIAGNOSIS — F329 Major depressive disorder, single episode, unspecified: Secondary | ICD-10-CM | POA: Diagnosis not present

## 2018-06-22 DIAGNOSIS — Z1331 Encounter for screening for depression: Secondary | ICD-10-CM | POA: Diagnosis not present

## 2018-06-22 DIAGNOSIS — Z0000 Encounter for general adult medical examination without abnormal findings: Secondary | ICD-10-CM | POA: Diagnosis not present

## 2018-06-22 DIAGNOSIS — I10 Essential (primary) hypertension: Secondary | ICD-10-CM | POA: Diagnosis not present

## 2018-10-08 DIAGNOSIS — H9391 Unspecified disorder of right ear: Secondary | ICD-10-CM | POA: Diagnosis not present

## 2018-10-08 DIAGNOSIS — J410 Simple chronic bronchitis: Secondary | ICD-10-CM | POA: Diagnosis not present

## 2018-10-08 DIAGNOSIS — H9041 Sensorineural hearing loss, unilateral, right ear, with unrestricted hearing on the contralateral side: Secondary | ICD-10-CM | POA: Diagnosis not present

## 2018-10-08 DIAGNOSIS — H9220 Otorrhagia, unspecified ear: Secondary | ICD-10-CM | POA: Diagnosis not present

## 2019-03-22 ENCOUNTER — Other Ambulatory Visit: Payer: Self-pay

## 2019-03-22 ENCOUNTER — Encounter (HOSPITAL_COMMUNITY): Payer: Self-pay | Admitting: Adult Health

## 2019-03-22 ENCOUNTER — Emergency Department (HOSPITAL_COMMUNITY)
Admission: EM | Admit: 2019-03-22 | Discharge: 2019-03-22 | Disposition: A | Discharge status: Home / Self Care | Payer: Medicare HMO | Attending: Emergency Medicine | Admitting: Emergency Medicine

## 2019-03-22 DIAGNOSIS — F17210 Nicotine dependence, cigarettes, uncomplicated: Secondary | ICD-10-CM | POA: Insufficient documentation

## 2019-03-22 DIAGNOSIS — M5442 Lumbago with sciatica, left side: Secondary | ICD-10-CM | POA: Diagnosis not present

## 2019-03-22 DIAGNOSIS — M545 Low back pain: Secondary | ICD-10-CM | POA: Diagnosis present

## 2019-03-22 MED ORDER — ONDANSETRON HCL 4 MG PO TABS
4.0000 mg | ORAL_TABLET | Freq: Once | ORAL | Status: AC
  Administered 2019-03-22: 4 mg via ORAL
  Filled 2019-03-22: qty 1

## 2019-03-22 MED ORDER — DEXAMETHASONE 4 MG PO TABS: 4.0000 mg | ORAL_TABLET | Freq: Two times a day (BID) | ORAL | 0 refills | Status: DC

## 2019-03-22 MED ORDER — DEXAMETHASONE SODIUM PHOSPHATE 10 MG/ML IJ SOLN
10.0000 mg | Freq: Once | INTRAMUSCULAR | Status: AC
  Administered 2019-03-22: 10 mg via INTRAMUSCULAR
  Filled 2019-03-22: qty 1

## 2019-03-22 MED ORDER — KETOROLAC TROMETHAMINE 10 MG PO TABS
10.0000 mg | ORAL_TABLET | Freq: Once | ORAL | Status: AC
  Administered 2019-03-22: 10 mg via ORAL
  Filled 2019-03-22: qty 1

## 2019-03-22 MED ORDER — TRAMADOL HCL 50 MG PO TABS
50.0000 mg | ORAL_TABLET | Freq: Once | ORAL | Status: AC
  Administered 2019-03-22: 50 mg via ORAL
  Filled 2019-03-22: qty 1

## 2019-03-22 MED ORDER — CYCLOBENZAPRINE HCL 10 MG PO TABS: 10.0000 mg | ORAL_TABLET | Freq: Three times a day (TID) | ORAL | 0 refills | Status: DC

## 2019-03-22 MED ORDER — CYCLOBENZAPRINE HCL 10 MG PO TABS
10.0000 mg | ORAL_TABLET | Freq: Once | ORAL | Status: AC
  Administered 2019-03-22: 10 mg via ORAL
  Filled 2019-03-22: qty 1

## 2019-03-22 MED ORDER — MELOXICAM 7.5 MG PO TABS: 7.5000 mg | ORAL_TABLET | Freq: Every day | ORAL | 0 refills | Status: DC
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
## 2019-03-22 NOTE — ED Triage Notes (Signed)
Presents with back pain that began Friday. Tried aleve x1 with no relief. Pain worse with movement and in lower left side of back described as catching. Denies urinary symptoms. Denies injury

## 2019-03-22 NOTE — Discharge Instructions (Addendum)
Your vital signs have been reviewed.  Your examination suggest an acute on chronic back pain with sciatica.  No acute neurologic deficit appreciated at this time.  Please use a heating pad to the lower back when you are at rest.  Please use Flexeril 3 times daily.  Use meloxicam daily with food.  Use Decadron 2 times daily with food.  Flexeril may cause drowsiness, and or lightheadedness.  Please use this medication with caution.  Please see Dr. Legrand Rams to review your back pain and pain management.

## 2019-03-23 DIAGNOSIS — H52229 Regular astigmatism, unspecified eye: Secondary | ICD-10-CM | POA: Diagnosis not present

## 2019-08-16 DIAGNOSIS — Z1331 Encounter for screening for depression: Secondary | ICD-10-CM | POA: Diagnosis not present

## 2019-08-16 DIAGNOSIS — F17200 Nicotine dependence, unspecified, uncomplicated: Secondary | ICD-10-CM | POA: Diagnosis not present

## 2019-08-16 DIAGNOSIS — Z23 Encounter for immunization: Secondary | ICD-10-CM | POA: Diagnosis not present

## 2019-08-16 DIAGNOSIS — I10 Essential (primary) hypertension: Secondary | ICD-10-CM | POA: Diagnosis not present

## 2019-08-16 DIAGNOSIS — Z1389 Encounter for screening for other disorder: Secondary | ICD-10-CM | POA: Diagnosis not present

## 2019-08-16 DIAGNOSIS — J410 Simple chronic bronchitis: Secondary | ICD-10-CM | POA: Diagnosis not present

## 2019-08-16 DIAGNOSIS — G478 Other sleep disorders: Secondary | ICD-10-CM | POA: Diagnosis not present

## 2019-08-16 DIAGNOSIS — F17210 Nicotine dependence, cigarettes, uncomplicated: Secondary | ICD-10-CM | POA: Diagnosis not present

## 2019-08-30 DIAGNOSIS — Z0001 Encounter for general adult medical examination with abnormal findings: Secondary | ICD-10-CM | POA: Diagnosis not present

## 2019-08-30 DIAGNOSIS — I10 Essential (primary) hypertension: Secondary | ICD-10-CM | POA: Diagnosis not present

## 2020-06-02 DIAGNOSIS — M75101 Unspecified rotator cuff tear or rupture of right shoulder, not specified as traumatic: Secondary | ICD-10-CM | POA: Diagnosis not present

## 2020-06-02 DIAGNOSIS — M542 Cervicalgia: Secondary | ICD-10-CM | POA: Diagnosis not present

## 2020-06-02 DIAGNOSIS — M25511 Pain in right shoulder: Secondary | ICD-10-CM | POA: Diagnosis not present

## 2020-07-03 DIAGNOSIS — J410 Simple chronic bronchitis: Secondary | ICD-10-CM | POA: Diagnosis not present

## 2020-07-03 DIAGNOSIS — Z23 Encounter for immunization: Secondary | ICD-10-CM | POA: Diagnosis not present

## 2020-07-03 DIAGNOSIS — F17200 Nicotine dependence, unspecified, uncomplicated: Secondary | ICD-10-CM | POA: Diagnosis not present

## 2020-07-03 DIAGNOSIS — H9203 Otalgia, bilateral: Secondary | ICD-10-CM | POA: Diagnosis not present

## 2020-07-12 ENCOUNTER — Encounter: Payer: Self-pay | Admitting: Internal Medicine

## 2020-07-12 DIAGNOSIS — M542 Cervicalgia: Secondary | ICD-10-CM | POA: Diagnosis not present

## 2020-07-12 DIAGNOSIS — M75101 Unspecified rotator cuff tear or rupture of right shoulder, not specified as traumatic: Secondary | ICD-10-CM | POA: Diagnosis not present

## 2020-07-29 DIAGNOSIS — M25511 Pain in right shoulder: Secondary | ICD-10-CM | POA: Diagnosis not present

## 2020-08-01 ENCOUNTER — Ambulatory Visit: Payer: Medicare HMO | Admitting: Internal Medicine

## 2020-08-07 DIAGNOSIS — J42 Unspecified chronic bronchitis: Secondary | ICD-10-CM | POA: Diagnosis not present

## 2020-08-07 DIAGNOSIS — F17200 Nicotine dependence, unspecified, uncomplicated: Secondary | ICD-10-CM | POA: Diagnosis not present

## 2020-08-07 DIAGNOSIS — K219 Gastro-esophageal reflux disease without esophagitis: Secondary | ICD-10-CM | POA: Diagnosis not present

## 2020-08-18 DIAGNOSIS — S43431A Superior glenoid labrum lesion of right shoulder, initial encounter: Secondary | ICD-10-CM | POA: Diagnosis not present

## 2020-08-18 DIAGNOSIS — M75111 Incomplete rotator cuff tear or rupture of right shoulder, not specified as traumatic: Secondary | ICD-10-CM | POA: Diagnosis not present

## 2020-08-18 DIAGNOSIS — M19011 Primary osteoarthritis, right shoulder: Secondary | ICD-10-CM | POA: Diagnosis not present

## 2020-08-26 DIAGNOSIS — R69 Illness, unspecified: Secondary | ICD-10-CM | POA: Diagnosis not present

## 2020-08-28 DIAGNOSIS — F17210 Nicotine dependence, cigarettes, uncomplicated: Secondary | ICD-10-CM | POA: Diagnosis not present

## 2020-08-28 DIAGNOSIS — F17200 Nicotine dependence, unspecified, uncomplicated: Secondary | ICD-10-CM | POA: Diagnosis not present

## 2020-08-28 DIAGNOSIS — Z0001 Encounter for general adult medical examination with abnormal findings: Secondary | ICD-10-CM | POA: Diagnosis not present

## 2020-08-28 DIAGNOSIS — Z1389 Encounter for screening for other disorder: Secondary | ICD-10-CM | POA: Diagnosis not present

## 2020-08-28 DIAGNOSIS — J410 Simple chronic bronchitis: Secondary | ICD-10-CM | POA: Diagnosis not present

## 2020-08-28 DIAGNOSIS — K219 Gastro-esophageal reflux disease without esophagitis: Secondary | ICD-10-CM | POA: Diagnosis not present

## 2020-08-30 ENCOUNTER — Other Ambulatory Visit: Payer: Self-pay | Admitting: Internal Medicine

## 2020-08-30 ENCOUNTER — Other Ambulatory Visit (HOSPITAL_COMMUNITY): Payer: Self-pay | Admitting: Internal Medicine

## 2020-08-30 DIAGNOSIS — Z87891 Personal history of nicotine dependence: Secondary | ICD-10-CM

## 2020-09-05 ENCOUNTER — Ambulatory Visit (HOSPITAL_COMMUNITY): Payer: Medicare Other | Attending: Specialist | Admitting: Occupational Therapy

## 2020-09-05 ENCOUNTER — Encounter (HOSPITAL_COMMUNITY): Payer: Self-pay

## 2020-09-12 ENCOUNTER — Telehealth: Payer: Self-pay | Admitting: Internal Medicine

## 2020-09-12 ENCOUNTER — Ambulatory Visit: Payer: Medicare Other | Admitting: Internal Medicine

## 2020-09-12 NOTE — Telephone Encounter (Signed)
Reviewed

## 2020-09-12 NOTE — Telephone Encounter (Signed)
PATIENT HAS BEEN A NO SHOW TOTAL OF 6 TIMES

## 2020-09-21 ENCOUNTER — Ambulatory Visit (HOSPITAL_COMMUNITY): Payer: Medicare Other | Attending: Specialist

## 2020-09-21 ENCOUNTER — Encounter (HOSPITAL_COMMUNITY): Payer: Self-pay

## 2020-10-06 ENCOUNTER — Other Ambulatory Visit: Payer: Self-pay

## 2020-10-06 ENCOUNTER — Telehealth: Payer: Self-pay | Admitting: Family Medicine

## 2020-10-06 ENCOUNTER — Ambulatory Visit
Admission: EM | Admit: 2020-10-06 | Discharge: 2020-10-06 | Disposition: A | Discharge status: Home / Self Care | Payer: Medicare Other

## 2020-10-06 ENCOUNTER — Ambulatory Visit (INDEPENDENT_AMBULATORY_CARE_PROVIDER_SITE_OTHER): Payer: Medicare Other

## 2020-10-06 DIAGNOSIS — R059 Cough, unspecified: Secondary | ICD-10-CM

## 2020-10-06 DIAGNOSIS — R0602 Shortness of breath: Secondary | ICD-10-CM | POA: Diagnosis not present

## 2020-10-06 DIAGNOSIS — J441 Chronic obstructive pulmonary disease with (acute) exacerbation: Secondary | ICD-10-CM | POA: Diagnosis not present

## 2020-10-06 MED ORDER — HYDROCODONE-HOMATROPINE 5-1.5 MG/5ML PO SYRP: 5.0000 mL | ORAL_SOLUTION | Freq: Four times a day (QID) | ORAL | 0 refills | Status: DC | PRN

## 2020-10-06 MED ORDER — PREDNISONE 10 MG PO TABS: 40.0000 mg | ORAL_TABLET | Freq: Every day | ORAL | 0 refills | Status: AC

## 2020-10-06 MED ORDER — PREDNISONE 10 MG PO TABS: 40.0000 mg | ORAL_TABLET | Freq: Every day | ORAL | 0 refills | Status: DC

## 2020-10-06 NOTE — ED Triage Notes (Signed)
Pt presents with c/o cough and sob for past 2 weeks

## 2020-10-06 NOTE — Discharge Instructions (Signed)
Your x-ray did not show any pneumonia.  Take the prednisone daily for 5 days for lung inflammation.  Cough syrup as needed.  This may make you drowsy.  Inhaler as needed for cough, wheezing or shortness of breath Follow up as needed for continued or worsening symptoms
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
## 2020-10-09 ENCOUNTER — Ambulatory Visit (HOSPITAL_COMMUNITY): Payer: Medicare Other

## 2020-10-09 NOTE — Telephone Encounter (Signed)
error 

## 2020-10-10 ENCOUNTER — Ambulatory Visit (HOSPITAL_COMMUNITY): Admission: RE | Admit: 2020-10-10 | Payer: Medicare Other | Source: Ambulatory Visit

## 2020-10-10 ENCOUNTER — Encounter (HOSPITAL_COMMUNITY): Payer: Self-pay

## 2020-10-11 ENCOUNTER — Ambulatory Visit (HOSPITAL_COMMUNITY): Payer: Medicare Other | Attending: Specialist | Admitting: Occupational Therapy

## 2020-11-21 DIAGNOSIS — K219 Gastro-esophageal reflux disease without esophagitis: Secondary | ICD-10-CM | POA: Diagnosis not present

## 2020-11-21 DIAGNOSIS — F17200 Nicotine dependence, unspecified, uncomplicated: Secondary | ICD-10-CM | POA: Diagnosis not present

## 2020-11-21 DIAGNOSIS — J410 Simple chronic bronchitis: Secondary | ICD-10-CM | POA: Diagnosis not present

## 2020-11-21 DIAGNOSIS — G478 Other sleep disorders: Secondary | ICD-10-CM | POA: Diagnosis not present

## 2021-01-09 ENCOUNTER — Encounter: Payer: Self-pay | Admitting: Internal Medicine

## 2021-01-09 ENCOUNTER — Ambulatory Visit (INDEPENDENT_AMBULATORY_CARE_PROVIDER_SITE_OTHER): Payer: Medicare Other | Admitting: Internal Medicine

## 2021-01-09 ENCOUNTER — Other Ambulatory Visit: Payer: Self-pay

## 2021-01-09 VITALS — BP 143/78 | HR 58 | Temp 97.9°F | Resp 18 | Ht 73.0 in | Wt 177.4 lb

## 2021-01-09 DIAGNOSIS — I739 Peripheral vascular disease, unspecified: Secondary | ICD-10-CM

## 2021-01-09 DIAGNOSIS — J42 Unspecified chronic bronchitis: Secondary | ICD-10-CM | POA: Diagnosis not present

## 2021-01-09 DIAGNOSIS — Z1211 Encounter for screening for malignant neoplasm of colon: Secondary | ICD-10-CM

## 2021-01-09 DIAGNOSIS — Z720 Tobacco use: Secondary | ICD-10-CM

## 2021-01-09 DIAGNOSIS — Z1159 Encounter for screening for other viral diseases: Secondary | ICD-10-CM | POA: Diagnosis not present

## 2021-01-09 DIAGNOSIS — J019 Acute sinusitis, unspecified: Secondary | ICD-10-CM

## 2021-01-09 DIAGNOSIS — Z122 Encounter for screening for malignant neoplasm of respiratory organs: Secondary | ICD-10-CM | POA: Diagnosis not present

## 2021-01-09 DIAGNOSIS — Z7689 Persons encountering health services in other specified circumstances: Secondary | ICD-10-CM

## 2021-01-09 DIAGNOSIS — Z114 Encounter for screening for human immunodeficiency virus [HIV]: Secondary | ICD-10-CM

## 2021-01-09 DIAGNOSIS — G4700 Insomnia, unspecified: Secondary | ICD-10-CM | POA: Diagnosis not present

## 2021-01-09 DIAGNOSIS — J449 Chronic obstructive pulmonary disease, unspecified: Secondary | ICD-10-CM | POA: Insufficient documentation

## 2021-01-09 MED ORDER — SALINE SPRAY 0.65 % NA SOLN: 1.0000 | NASAL | 0 refills | Status: AC | PRN

## 2021-01-09 MED ORDER — AMOXICILLIN-POT CLAVULANATE 875-125 MG PO TABS: 1.0000 | ORAL_TABLET | Freq: Two times a day (BID) | ORAL | 0 refills | Status: DC

## 2021-01-09 MED ORDER — BREO ELLIPTA 100-25 MCG/INH IN AEPB: 1.0000 | INHALATION_SPRAY | Freq: Every day | RESPIRATORY_TRACT | 2 refills | Status: DC

## 2021-01-09 NOTE — Assessment & Plan Note (Signed)
S/p b/l femoral artery bypass graft Not on statin currently, plan to start in the next visit

## 2021-01-09 NOTE — Assessment & Plan Note (Signed)
H/o chronic bronchitis Started Breo Continue Albuterol PRN

## 2021-01-09 NOTE — Assessment & Plan Note (Signed)
Persistent symptoms, started Augmentin Nasal saline spray

## 2021-01-09 NOTE — Patient Instructions (Addendum)
Please start taking Augmentin for sinusitis. Use nasal saline spray for nasal congestion.  Please start using Breo inhaler for bronchitis/COPD. Use Proair on as needed basis only for shortness of breath/wheezing.  Please follow DASH diet and perform moderate exercise/walking at least 150 mins/week.   [email or web address]">  DASH Eating Plan DASH stands for Dietary Approaches to Stop Hypertension. The DASH eating plan is a healthy eating plan that has been shown to:  Reduce high blood pressure (hypertension).  Reduce your risk for type 2 diabetes, heart disease, and stroke.  Help with weight loss. What are tips for following this plan? Reading food labels  Check food labels for the amount of salt (sodium) per serving. Choose foods with less than 5 percent of the Daily Value of sodium. Generally, foods with less than 300 milligrams (mg) of sodium per serving fit into this eating plan.  To find whole grains, look for the word "whole" as the first word in the ingredient list. Shopping  Buy products labeled as "low-sodium" or "no salt added."  Buy fresh foods. Avoid canned foods and pre-made or frozen meals. Cooking  Avoid adding salt when cooking. Use salt-free seasonings or herbs instead of table salt or sea salt. Check with your health care provider or pharmacist before using salt substitutes.  Do not fry foods. Cook foods using healthy methods such as baking, boiling, grilling, roasting, and broiling instead.  Cook with heart-healthy oils, such as olive, canola, avocado, soybean, or sunflower oil. Meal planning  Eat a balanced diet that includes: ? 4 or more servings of fruits and 4 or more servings of vegetables each day. Try to fill one-half of your plate with fruits and vegetables. ? 6-8 servings of whole grains each day. ? Less than 6 oz (170 g) of lean meat, poultry, or fish each day. A 3-oz (85-g) serving of meat is about the  same size as a deck of cards. One egg equals 1 oz (28 g). ? 2-3 servings of low-fat dairy each day. One serving is 1 cup (237 mL). ? 1 serving of nuts, seeds, or beans 5 times each week. ? 2-3 servings of heart-healthy fats. Healthy fats called omega-3 fatty acids are found in foods such as walnuts, flaxseeds, fortified milks, and eggs. These fats are also found in cold-water fish, such as sardines, salmon, and mackerel.  Limit how much you eat of: ? Canned or prepackaged foods. ? Food that is high in trans fat, such as some fried foods. ? Food that is high in saturated fat, such as fatty meat. ? Desserts and other sweets, sugary drinks, and other foods with added sugar. ? Full-fat dairy products.  Do not salt foods before eating.  Do not eat more than 4 egg yolks a week.  Try to eat at least 2 vegetarian meals a week.  Eat more home-cooked food and less restaurant, buffet, and fast food.   Lifestyle  When eating at a restaurant, ask that your food be prepared with less salt or no salt, if possible.  If you drink alcohol: ? Limit how much you use to:  0-1 drink a day for women who are not pregnant.  0-2 drinks a day for men. ? Be aware of how much alcohol is in your drink. In the [place] one drink equals one 12 oz bottle of beer (355 mL), one 5 oz glass of wine (148 mL), or one 1 oz glass of hard liquor (44 mL). General information  Avoid eating more than  2,300 mg of salt a day. If you have hypertension, you may need to reduce your sodium intake to 1,500 mg a day.  Work with your health care provider to maintain a healthy body weight or to lose weight. Ask what an ideal weight is for you.  Get at least 30 minutes of exercise that causes your heart to beat faster (aerobic exercise) most days of the week. Activities may include walking, swimming, or biking.  Work with your health care provider or dietitian to adjust your eating plan to your individual calorie needs. What foods  should I eat? Fruits All fresh, dried, or frozen fruit. Canned fruit in natural juice (without added sugar). Vegetables Fresh or frozen vegetables (raw, steamed, roasted, or grilled). Low-sodium or reduced-sodium tomato and vegetable juice. Low-sodium or reduced-sodium tomato sauce and tomato paste. Low-sodium or reduced-sodium canned vegetables. Grains Whole-grain or whole-wheat bread. Whole-grain or whole-wheat pasta. Brown rice. [place]. Bulgur. Whole-grain and low-sodium cereals. Pita bread. Low-fat, low-sodium crackers. Whole-wheat flour tortillas. Meats and other proteins Skinless chicken or [place]. Ground chicken or [place]. Pork with fat trimmed off. Fish and seafood. Egg whites. Dried beans, peas, or lentils. Unsalted nuts, nut butters, and seeds. Unsalted canned beans. Lean cuts of beef with fat trimmed off. Low-sodium, lean precooked or cured meat, such as sausages or meat loaves. Dairy Low-fat (1%) or fat-free (skim) milk. Reduced-fat, low-fat, or fat-free cheeses. Nonfat, low-sodium ricotta or cottage cheese. Low-fat or nonfat yogurt. Low-fat, low-sodium cheese. Fats and oils Soft margarine without trans fats. Vegetable oil. Reduced-fat, low-fat, or light mayonnaise and salad dressings (reduced-sodium). Canola, safflower, olive, avocado, soybean, and sunflower oils. Avocado. Seasonings and condiments Herbs. Spices. Seasoning mixes without salt. Other foods Unsalted popcorn and pretzels. Fat-free sweets. The items listed above may not be a complete list of foods and beverages you can eat. Contact a dietitian for more information. What foods should I avoid? Fruits Canned fruit in a light or heavy syrup. Fried fruit. Fruit in cream or butter sauce. Vegetables Creamed or fried vegetables. Vegetables in a cheese sauce. Regular canned vegetables (not low-sodium or reduced-sodium). Regular canned tomato sauce and paste (not low-sodium or reduced-sodium). Regular tomato and  vegetable juice (not low-sodium or reduced-sodium). Angie Fava. Olives. Grains Baked goods made with fat, such as croissants, muffins, or some breads. Dry pasta or rice meal packs. Meats and other proteins Fatty cuts of meat. Ribs. Fried meat. Berniece Salines. Bologna, salami, and other precooked or cured meats, such as sausages or meat loaves. Fat from the back of a pig (fatback). Bratwurst. Salted nuts and seeds. Canned beans with added salt. Canned or smoked fish. Whole eggs or egg yolks. Chicken or [place] with skin. Dairy Whole or 2% milk, cream, and half-and-half. Whole or full-fat cream cheese. Whole-fat or sweetened yogurt. Full-fat cheese. Nondairy creamers. Whipped toppings. Processed cheese and cheese spreads. Fats and oils Butter. Stick margarine. Lard. Shortening. Ghee. Bacon fat. Tropical oils, such as coconut, palm kernel, or palm oil. Seasonings and condiments Onion salt, garlic salt, seasoned salt, table salt, and sea salt. Worcestershire sauce. Tartar sauce. Barbecue sauce. Teriyaki sauce. Soy sauce, including reduced-sodium. Steak sauce. Canned and packaged gravies. Fish sauce. Oyster sauce. Cocktail sauce. Store-bought horseradish. Ketchup. Mustard. Meat flavorings and tenderizers. Bouillon cubes. Hot sauces. Pre-made or packaged marinades. Pre-made or packaged taco seasonings. Relishes. Regular salad dressings. Other foods Salted popcorn and pretzels. The items listed above may not be a complete list of foods and beverages you should avoid. Contact a dietitian for more  information. Where to find more information  National Heart, Lung, and Blood Institute: https://wilson-eaton.com/  American Heart Association: www.heart.org  Academy of Nutrition and Dietetics: www.eatright.[place]: www.kidney.org Summary  The DASH eating plan is a healthy eating plan that has been shown to reduce high blood pressure (hypertension). It may also reduce your risk for type 2 diabetes, heart  disease, and stroke.  When on the DASH eating plan, aim to eat more fresh fruits and vegetables, whole grains, lean proteins, low-fat dairy, and heart-healthy fats.  With the DASH eating plan, you should limit salt (sodium) intake to 2,300 mg a day. If you have hypertension, you may need to reduce your sodium intake to 1,500 mg a day.  Work with your health care provider or dietitian to adjust your eating plan to your individual calorie needs. This information is not intended to replace advice given to you by your health care provider. Make sure you discuss any questions you have with your health care provider. Document Revised: 08/27/2019 Document Reviewed: 08/27/2019 Elsevier Patient Education  2021 [employer].

## 2021-01-09 NOTE — Assessment & Plan Note (Signed)
Continue Trazodone 

## 2021-01-09 NOTE — Assessment & Plan Note (Addendum)
Care established History and medications reviewed with the patient 

## 2021-01-09 NOTE — Assessment & Plan Note (Signed)
Smokes about 1.5 pack/day  Asked about quitting: confirms that he currently smokes cigarettes Advise to quit smoking: Educated about QUITTING to reduce the risk of cancer, cardio and cerebrovascular disease. Assess willingness: Unwilling to quit at this time, but is working on cutting back. Assist with counseling and pharmacotherapy: Counseled for 5 minutes and literature provided. Arrange for follow up: Follow up in 3 months and continue to offer help.
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
## 2021-02-07 ENCOUNTER — Encounter: Payer: Self-pay | Admitting: Internal Medicine

## 2021-02-13 DIAGNOSIS — E782 Mixed hyperlipidemia: Secondary | ICD-10-CM | POA: Diagnosis not present

## 2021-02-13 DIAGNOSIS — Z1159 Encounter for screening for other viral diseases: Secondary | ICD-10-CM | POA: Diagnosis not present

## 2021-02-13 DIAGNOSIS — I10 Essential (primary) hypertension: Secondary | ICD-10-CM | POA: Diagnosis not present

## 2021-02-13 DIAGNOSIS — I739 Peripheral vascular disease, unspecified: Secondary | ICD-10-CM | POA: Diagnosis not present

## 2021-02-13 DIAGNOSIS — Z7689 Persons encountering health services in other specified circumstances: Secondary | ICD-10-CM | POA: Diagnosis not present

## 2021-02-14 LAB — HIV ANTIBODY (ROUTINE TESTING W REFLEX): HIV Screen 4th Generation wRfx: NONREACTIVE

## 2021-02-14 LAB — CBC WITH DIFFERENTIAL/PLATELET
Basophils Absolute: 0.1 10*3/uL (ref 0.0–0.2)
Basos: 1 %
EOS (ABSOLUTE): 0.3 10*3/uL (ref 0.0–0.4)
Eos: 4 %
Hematocrit: 47.7 % (ref 37.5–51.0)
Hemoglobin: 16.3 g/dL (ref 13.0–17.7)
Immature Grans (Abs): 0 10*3/uL (ref 0.0–0.1)
Immature Granulocytes: 0 %
Lymphocytes Absolute: 2.2 10*3/uL (ref 0.7–3.1)
Lymphs: 23 %
MCH: 29.6 pg (ref 26.6–33.0)
MCHC: 34.2 g/dL (ref 31.5–35.7)
MCV: 87 fL (ref 79–97)
Monocytes Absolute: 0.5 10*3/uL (ref 0.1–0.9)
Monocytes: 5 %
Neutrophils Absolute: 6.2 10*3/uL (ref 1.4–7.0)
Neutrophils: 67 %
Platelets: 190 10*3/uL (ref 150–450)
RBC: 5.5 x10E6/uL (ref 4.14–5.80)
RDW: 12.4 % (ref 11.6–15.4)
WBC: 9.3 10*3/uL (ref 3.4–10.8)

## 2021-02-14 LAB — TSH: TSH: 1.84 u[IU]/mL (ref 0.450–4.500)

## 2021-02-14 LAB — HEPATITIS C ANTIBODY: Hep C Virus Ab: <0.1 s/co ratio (ref 0.0–0.9)

## 2021-02-14 LAB — VITAMIN D 25 HYDROXY (VIT D DEFICIENCY, FRACTURES): Vit D, 25-Hydroxy: 27.8 ng/mL — ABNORMAL LOW (ref 30.0–100.0)

## 2021-02-14 LAB — HEMOGLOBIN A1C
Est. average glucose Bld gHb Est-mCnc: 111 mg/dL
Hgb A1c MFr Bld: 5.5 % (ref 4.8–5.6)

## 2021-02-14 LAB — CMP14+EGFR
ALT: 14 IU/L (ref 0–44)
AST: 18 IU/L (ref 0–40)
Albumin/Globulin Ratio: 1.5 (ref 1.2–2.2)
Albumin: 4.4 g/dL (ref 3.8–4.8)
Alkaline Phosphatase: 46 IU/L (ref 44–121)
BUN/Creatinine Ratio: 11 (ref 10–24)
BUN: 12 mg/dL (ref 8–27)
Bilirubin Total: 0.4 mg/dL (ref 0.0–1.2)
CO2: 25 mmol/L (ref 20–29)
Calcium: 9.2 mg/dL (ref 8.6–10.2)
Chloride: 102 mmol/L (ref 96–106)
Creatinine, Ser: 1.13 mg/dL (ref 0.76–1.27)
Globulin, Total: 3 g/dL (ref 1.5–4.5)
Glucose: 102 mg/dL — ABNORMAL HIGH (ref 65–99)
Potassium: 4.4 mmol/L (ref 3.5–5.2)
Sodium: 139 mmol/L (ref 134–144)
Total Protein: 7.4 g/dL (ref 6.0–8.5)
eGFR: 73 mL/min/{1.73_m2} (ref >59)

## 2021-02-14 LAB — LIPID PANEL
Chol/HDL Ratio: 5.7 ratio — ABNORMAL HIGH (ref 0.0–5.0)
Cholesterol, Total: 217 mg/dL — ABNORMAL HIGH (ref 100–199)
HDL: 38 mg/dL — ABNORMAL LOW (ref >39)
LDL Chol Calc (NIH): 134 mg/dL — ABNORMAL HIGH (ref 0–99)
Triglycerides: 252 mg/dL — ABNORMAL HIGH (ref 0–149)
VLDL Cholesterol Cal: 45 mg/dL — ABNORMAL HIGH (ref 5–40)

## 2021-02-20 ENCOUNTER — Other Ambulatory Visit: Payer: Self-pay

## 2021-02-20 ENCOUNTER — Encounter: Payer: Self-pay | Admitting: Internal Medicine

## 2021-02-20 ENCOUNTER — Ambulatory Visit (INDEPENDENT_AMBULATORY_CARE_PROVIDER_SITE_OTHER): Payer: Medicare Other | Admitting: Internal Medicine

## 2021-02-20 VITALS — BP 150/80 | HR 75 | Temp 97.8°F | Resp 18 | Ht 73.0 in | Wt 180.4 lb

## 2021-02-20 DIAGNOSIS — I10 Essential (primary) hypertension: Secondary | ICD-10-CM | POA: Diagnosis not present

## 2021-02-20 DIAGNOSIS — I739 Peripheral vascular disease, unspecified: Secondary | ICD-10-CM | POA: Diagnosis not present

## 2021-02-20 DIAGNOSIS — E782 Mixed hyperlipidemia: Secondary | ICD-10-CM

## 2021-02-20 DIAGNOSIS — F17210 Nicotine dependence, cigarettes, uncomplicated: Secondary | ICD-10-CM

## 2021-02-20 DIAGNOSIS — M25521 Pain in right elbow: Secondary | ICD-10-CM

## 2021-02-20 DIAGNOSIS — Z720 Tobacco use: Secondary | ICD-10-CM

## 2021-02-20 MED ORDER — TELMISARTAN 20 MG PO TABS: 20.0000 mg | ORAL_TABLET | Freq: Every day | ORAL | 3 refills | Status: DC

## 2021-02-20 MED ORDER — ROSUVASTATIN CALCIUM 10 MG PO TABS: 5.0000 mg | ORAL_TABLET | Freq: Every day | ORAL | 1 refills | Status: DC

## 2021-02-20 NOTE — Patient Instructions (Addendum)
Please start taking Telmisartan for high blood pressure.  Please start taking Rosuvastatin for cholesterol and history peripheral arterial disease.  You are being referred to Orthopedic surgeon for evaluation of elbow pain.   [email or web address]  DASH Eating Plan DASH stands for Dietary Approaches to Stop Hypertension. The DASH eating plan is a healthy eating plan that has been shown to:  Reduce high blood pressure (hypertension).  Reduce your risk for type 2 diabetes, heart disease, and stroke.  Help with weight loss. What are tips for following this plan? Reading food labels  Check food labels for the amount of salt (sodium) per serving. Choose foods with less than 5 percent of the Daily Value of sodium. Generally, foods with less than 300 milligrams (mg) of sodium per serving fit into this eating plan.  To find whole grains, look for the word "whole" as the first word in the ingredient list. Shopping  Buy products labeled as "low-sodium" or "no salt added."  Buy fresh foods. Avoid canned foods and pre-made or frozen meals. Cooking  Avoid adding salt when cooking. Use salt-free seasonings or herbs instead of table salt or sea salt. Check with your health care provider or pharmacist before using salt substitutes.  Do not fry foods. Cook foods using healthy methods such as baking, boiling, grilling, roasting, and broiling instead.  Cook with heart-healthy oils, such as olive, canola, avocado, soybean, or sunflower oil. Meal planning  Eat a balanced diet that includes: ? 4 or more servings of fruits and 4 or more servings of vegetables each day. Try to fill one-half of your plate with fruits and vegetables. ? 6-8 servings of whole grains each day. ? Less than 6 oz (170 g) of lean meat, poultry, or fish each day. A 3-oz (85-g) serving of meat is about the same size as a deck of cards. One egg equals 1 oz (28 g). ? 2-3 servings of  low-fat dairy each day. One serving is 1 cup (237 mL). ? 1 serving of nuts, seeds, or beans 5 times each week. ? 2-3 servings of heart-healthy fats. Healthy fats called omega-3 fatty acids are found in foods such as walnuts, flaxseeds, fortified milks, and eggs. These fats are also found in cold-water fish, such as sardines, salmon, and mackerel.  Limit how much you eat of: ? Canned or prepackaged foods. ? Food that is high in trans fat, such as some fried foods. ? Food that is high in saturated fat, such as fatty meat. ? Desserts and other sweets, sugary drinks, and other foods with added sugar. ? Full-fat dairy products.  Do not salt foods before eating.  Do not eat more than 4 egg yolks a week.  Try to eat at least 2 vegetarian meals a week.  Eat more home-cooked food and less restaurant, buffet, and fast food.   Lifestyle  When eating at a restaurant, ask that your food be prepared with less salt or no salt, if possible.  If you drink alcohol: ? Limit how much you use to:  0-1 drink a day for women who are not pregnant.  0-2 drinks a day for men. ? Be aware of how much alcohol is in your drink. In the [place] one drink equals one 12 oz bottle of beer (355 mL), one 5 oz glass of wine (148 mL), or one 1 oz glass of hard liquor (44 mL). General information  Avoid eating more than 2,300 mg of salt a day. If you have hypertension, you may  need to reduce your sodium intake to 1,500 mg a day.  Work with your health care provider to maintain a healthy body weight or to lose weight. Ask what an ideal weight is for you.  Get at least 30 minutes of exercise that causes your heart to beat faster (aerobic exercise) most days of the week. Activities may include walking, swimming, or biking.  Work with your health care provider or dietitian to adjust your eating plan to your individual calorie needs. What foods should I eat? Fruits All fresh, dried, or frozen fruit. Canned fruit in  natural juice (without added sugar). Vegetables Fresh or frozen vegetables (raw, steamed, roasted, or grilled). Low-sodium or reduced-sodium tomato and vegetable juice. Low-sodium or reduced-sodium tomato sauce and tomato paste. Low-sodium or reduced-sodium canned vegetables. Grains Whole-grain or whole-wheat bread. Whole-grain or whole-wheat pasta. Brown rice. [place]. Bulgur. Whole-grain and low-sodium cereals. Pita bread. Low-fat, low-sodium crackers. Whole-wheat flour tortillas. Meats and other proteins Skinless chicken or [place]. Ground chicken or [place]. Pork with fat trimmed off. Fish and seafood. Egg whites. Dried beans, peas, or lentils. Unsalted nuts, nut butters, and seeds. Unsalted canned beans. Lean cuts of beef with fat trimmed off. Low-sodium, lean precooked or cured meat, such as sausages or meat loaves. Dairy Low-fat (1%) or fat-free (skim) milk. Reduced-fat, low-fat, or fat-free cheeses. Nonfat, low-sodium ricotta or cottage cheese. Low-fat or nonfat yogurt. Low-fat, low-sodium cheese. Fats and oils Soft margarine without trans fats. Vegetable oil. Reduced-fat, low-fat, or light mayonnaise and salad dressings (reduced-sodium). Canola, safflower, olive, avocado, soybean, and sunflower oils. Avocado. Seasonings and condiments Herbs. Spices. Seasoning mixes without salt. Other foods Unsalted popcorn and pretzels. Fat-free sweets. The items listed above may not be a complete list of foods and beverages you can eat. Contact a dietitian for more information. What foods should I avoid? Fruits Canned fruit in a light or heavy syrup. Fried fruit. Fruit in cream or butter sauce. Vegetables Creamed or fried vegetables. Vegetables in a cheese sauce. Regular canned vegetables (not low-sodium or reduced-sodium). Regular canned tomato sauce and paste (not low-sodium or reduced-sodium). Regular tomato and vegetable juice (not low-sodium or reduced-sodium). Angie Fava.  Olives. Grains Baked goods made with fat, such as croissants, muffins, or some breads. Dry pasta or rice meal packs. Meats and other proteins Fatty cuts of meat. Ribs. Fried meat. Berniece Salines. Bologna, salami, and other precooked or cured meats, such as sausages or meat loaves. Fat from the back of a pig (fatback). Bratwurst. Salted nuts and seeds. Canned beans with added salt. Canned or smoked fish. Whole eggs or egg yolks. Chicken or [place] with skin. Dairy Whole or 2% milk, cream, and half-and-half. Whole or full-fat cream cheese. Whole-fat or sweetened yogurt. Full-fat cheese. Nondairy creamers. Whipped toppings. Processed cheese and cheese spreads. Fats and oils Butter. Stick margarine. Lard. Shortening. Ghee. Bacon fat. Tropical oils, such as coconut, palm kernel, or palm oil. Seasonings and condiments Onion salt, garlic salt, seasoned salt, table salt, and sea salt. Worcestershire sauce. Tartar sauce. Barbecue sauce. Teriyaki sauce. Soy sauce, including reduced-sodium. Steak sauce. Canned and packaged gravies. Fish sauce. Oyster sauce. Cocktail sauce. Store-bought horseradish. Ketchup. Mustard. Meat flavorings and tenderizers. Bouillon cubes. Hot sauces. Pre-made or packaged marinades. Pre-made or packaged taco seasonings. Relishes. Regular salad dressings. Other foods Salted popcorn and pretzels. The items listed above may not be a complete list of foods and beverages you should avoid. Contact a dietitian for more information. Where to find more information  National Heart, Lung, and Blood  Institute: https://wilson-eaton.com/  American Heart Association: www.heart.org  Academy of Nutrition and Dietetics: www.eatright.[place]: www.kidney.org Summary  The DASH eating plan is a healthy eating plan that has been shown to reduce high blood pressure (hypertension). It may also reduce your risk for type 2 diabetes, heart disease, and stroke.  When on the DASH eating plan, aim to  eat more fresh fruits and vegetables, whole grains, lean proteins, low-fat dairy, and heart-healthy fats.  With the DASH eating plan, you should limit salt (sodium) intake to 2,300 mg a day. If you have hypertension, you may need to reduce your sodium intake to 1,500 mg a day.  Work with your health care provider or dietitian to adjust your eating plan to your individual calorie needs. This information is not intended to replace advice given to you by your health care provider. Make sure you discuss any questions you have with your health care provider. Document Revised: 08/27/2019 Document Reviewed: 08/27/2019 Elsevier Patient Education  2021 [employer].

## 2021-02-20 NOTE — Assessment & Plan Note (Signed)
Could be bursitis Used to get steroid injections Referred to Orthopedic surgery Continue Tylenol PRN and heating pads/ice for swelling

## 2021-02-20 NOTE — Assessment & Plan Note (Signed)
Smokes about 1.5 pack/day  Asked about quitting: confirms that he currently smokes cigarettes Advise to quit smoking: Educated about QUITTING to reduce the risk of cancer, cardio and cerebrovascular disease. Assess willingness: Unwilling to quit at this time, but is working on cutting back. Assist with counseling and pharmacotherapy: Counseled for 5 minutes and literature provided. Arrange for follow up: Follow up in 3 months and continue to offer help.

## 2021-02-20 NOTE — Assessment & Plan Note (Signed)
S/p b/l femoral artery bypass graft Started Crestor 10 mg QD On Aspirin
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
## 2021-02-20 NOTE — Assessment & Plan Note (Signed)
Lipid profile reviewed Started Crestor

## 2021-02-20 NOTE — Assessment & Plan Note (Signed)
BP Readings from Last 1 Encounters:  02/20/21 (!) 150/80   New-onset Started Telmisartan 20 mg QD Counseled for compliance with the medications Advised DASH diet and moderate exercise/walking, at least 150 mins/week

## 2021-02-23 ENCOUNTER — Ambulatory Visit: Payer: Medicare Other | Admitting: Gastroenterology

## 2021-02-26 ENCOUNTER — Other Ambulatory Visit: Payer: Self-pay

## 2021-02-26 ENCOUNTER — Encounter (HOSPITAL_COMMUNITY): Payer: Self-pay | Admitting: *Deleted

## 2021-02-26 ENCOUNTER — Emergency Department (HOSPITAL_COMMUNITY)
Admission: EM | Admit: 2021-02-26 | Discharge: 2021-02-26 | Disposition: A | Discharge status: Home / Self Care | Payer: Medicare Other | Attending: Emergency Medicine | Admitting: Emergency Medicine

## 2021-02-26 DIAGNOSIS — S30861A Insect bite (nonvenomous) of abdominal wall, initial encounter: Secondary | ICD-10-CM | POA: Insufficient documentation

## 2021-02-26 DIAGNOSIS — Z5321 Procedure and treatment not carried out due to patient leaving prior to being seen by health care provider: Secondary | ICD-10-CM | POA: Diagnosis not present

## 2021-02-26 DIAGNOSIS — S3991XA Unspecified injury of abdomen, initial encounter: Secondary | ICD-10-CM | POA: Diagnosis present

## 2021-02-26 DIAGNOSIS — W57XXXA Bitten or stung by nonvenomous insect and other nonvenomous arthropods, initial encounter: Secondary | ICD-10-CM | POA: Diagnosis not present

## 2021-02-26 NOTE — ED Triage Notes (Signed)
Tick bite to left groin area, state the area is red

## 2021-02-27 ENCOUNTER — Encounter (HOSPITAL_COMMUNITY): Payer: Self-pay

## 2021-02-27 NOTE — Progress Notes (Signed)
Attempted to reach patient regarding Lung Cancer Screening. Unable to reach patient at this time. Detailed VM left asking patient to return my call.

## 2021-03-06 ENCOUNTER — Other Ambulatory Visit: Payer: Self-pay

## 2021-03-06 ENCOUNTER — Encounter: Payer: Self-pay | Admitting: Orthopaedic Surgery

## 2021-03-06 ENCOUNTER — Ambulatory Visit (INDEPENDENT_AMBULATORY_CARE_PROVIDER_SITE_OTHER): Payer: Medicare Other | Admitting: Orthopaedic Surgery

## 2021-03-06 VITALS — BP 148/76 | HR 61 | Ht 73.0 in | Wt 178.0 lb

## 2021-03-06 DIAGNOSIS — M7711 Lateral epicondylitis, right elbow: Secondary | ICD-10-CM
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
## 2021-04-05 ENCOUNTER — Ambulatory Visit: Payer: Medicare Other | Admitting: Orthopaedic Surgery

## 2021-04-16 ENCOUNTER — Encounter (HOSPITAL_COMMUNITY): Payer: Self-pay

## 2021-04-16 NOTE — Progress Notes (Signed)
Attempted to reach patient regarding lung cancer screening. Unable to reach patient at this time. Detailed VM left asking patient to return my call.

## 2021-05-26 ENCOUNTER — Other Ambulatory Visit: Payer: Self-pay | Admitting: Internal Medicine

## 2021-05-26 DIAGNOSIS — I10 Essential (primary) hypertension: Secondary | ICD-10-CM

## 2021-05-28 ENCOUNTER — Encounter: Payer: Self-pay | Admitting: Internal Medicine

## 2021-05-28 ENCOUNTER — Ambulatory Visit: Payer: Medicare Other | Admitting: Gastroenterology

## 2021-06-21 ENCOUNTER — Ambulatory Visit (INDEPENDENT_AMBULATORY_CARE_PROVIDER_SITE_OTHER): Payer: Medicare Other

## 2021-06-21 ENCOUNTER — Other Ambulatory Visit: Payer: Self-pay

## 2021-06-21 DIAGNOSIS — Z5329 Procedure and treatment not carried out because of patient's decision for other reasons: Secondary | ICD-10-CM
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
## 2021-06-25 ENCOUNTER — Encounter: Payer: Self-pay | Admitting: Internal Medicine

## 2021-06-25 ENCOUNTER — Ambulatory Visit (INDEPENDENT_AMBULATORY_CARE_PROVIDER_SITE_OTHER): Payer: Medicare Other | Admitting: Internal Medicine

## 2021-06-25 ENCOUNTER — Other Ambulatory Visit: Payer: Self-pay

## 2021-06-25 VITALS — BP 134/74 | HR 59 | Temp 97.6°F | Resp 18 | Ht 73.0 in | Wt 176.0 lb

## 2021-06-25 DIAGNOSIS — E559 Vitamin D deficiency, unspecified: Secondary | ICD-10-CM

## 2021-06-25 DIAGNOSIS — Z23 Encounter for immunization: Secondary | ICD-10-CM | POA: Diagnosis not present

## 2021-06-25 DIAGNOSIS — Z122 Encounter for screening for malignant neoplasm of respiratory organs: Secondary | ICD-10-CM | POA: Diagnosis not present

## 2021-06-25 DIAGNOSIS — E782 Mixed hyperlipidemia: Secondary | ICD-10-CM

## 2021-06-25 DIAGNOSIS — I10 Essential (primary) hypertension: Secondary | ICD-10-CM

## 2021-06-25 DIAGNOSIS — I739 Peripheral vascular disease, unspecified: Secondary | ICD-10-CM

## 2021-06-25 DIAGNOSIS — F17210 Nicotine dependence, cigarettes, uncomplicated: Secondary | ICD-10-CM

## 2021-06-25 DIAGNOSIS — G4700 Insomnia, unspecified: Secondary | ICD-10-CM | POA: Diagnosis not present

## 2021-06-25 DIAGNOSIS — J42 Unspecified chronic bronchitis: Secondary | ICD-10-CM

## 2021-06-25 DIAGNOSIS — Z125 Encounter for screening for malignant neoplasm of prostate: Secondary | ICD-10-CM

## 2021-06-25 DIAGNOSIS — Z720 Tobacco use: Secondary | ICD-10-CM

## 2021-06-25 MED ORDER — ROSUVASTATIN CALCIUM 10 MG PO TABS: 5.0000 mg | ORAL_TABLET | Freq: Every day | ORAL | 1 refills | Status: DC

## 2021-06-25 MED ORDER — TRAZODONE HCL 100 MG PO TABS: 100.0000 mg | ORAL_TABLET | Freq: Every day | ORAL | 1 refills | Status: DC

## 2021-06-25 NOTE — Patient Instructions (Addendum)
Please continue taking medications as prescribed.  Please try to cut down -> quit smoking.  You are being scheduled to get CT chest for lung cancer screening.  Please get fasting blood tests before the next visit.

## 2021-06-29 NOTE — Assessment & Plan Note (Signed)
S/p b/l femoral artery bypass graft Started Crestor 10 mg QD On Aspirin

## 2021-06-29 NOTE — Assessment & Plan Note (Addendum)
BP Readings from Last 1 Encounters:  06/25/21 134/74   Well-controlled Continue Telmisartan 20 mg QD Counseled for compliance with the medications Advised DASH diet and moderate exercise/walking, at least 150 mins/week

## 2021-06-29 NOTE — Assessment & Plan Note (Signed)
On Crestor 

## 2021-06-29 NOTE — Assessment & Plan Note (Signed)
Well-controlled with Breo and as needed albuterol 

## 2021-06-29 NOTE — Assessment & Plan Note (Signed)
continue trazodone for now
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
## 2021-06-29 NOTE — Assessment & Plan Note (Signed)
Smokes about 1.5 pack/day  Asked about quitting: confirms that he currently smokes cigarettes Advise to quit smoking: Educated about QUITTING to reduce the risk of cancer, cardio and cerebrovascular disease. Assess willingness: Unwilling to quit at this time, but is working on cutting back. Assist with counseling and pharmacotherapy: Counseled for 5 minutes and literature provided. Arrange for follow up: Follow up in 3 months and continue to offer help.

## 2021-07-09 ENCOUNTER — Other Ambulatory Visit: Payer: Self-pay

## 2021-07-09 ENCOUNTER — Encounter (INDEPENDENT_AMBULATORY_CARE_PROVIDER_SITE_OTHER): Payer: Self-pay

## 2021-07-09 ENCOUNTER — Ambulatory Visit (INDEPENDENT_AMBULATORY_CARE_PROVIDER_SITE_OTHER): Payer: Medicare Other

## 2021-07-09 VITALS — BP 145/62 | HR 63 | Temp 98.6°F | Ht 73.0 in | Wt 177.0 lb

## 2021-07-09 DIAGNOSIS — Z0000 Encounter for general adult medical examination without abnormal findings: Secondary | ICD-10-CM | POA: Diagnosis not present
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
## 2021-07-09 NOTE — Patient Instructions (Signed)
[name] , Thank you for taking time to come for your Medicare Wellness Visit. I appreciate your ongoing commitment to your health goals. Please review the following plan we discussed and let me know if I can assist you in the future.   Screening recommendations/referrals: Colonoscopy: Done 04/17/2015 Repeat in 10 years  Recommended yearly ophthalmology/optometry visit for glaucoma screening and checkup Recommended yearly dental visit for hygiene and checkup  Vaccinations: Influenza vaccine: Done 06/25/2021 Pneumococcal vaccine: Done 09/22/2016 Tdap vaccine: Done 06/12/2018 Repeat in 10 years  Shingles vaccine: Shingrix discussed. Please contact your pharmacy for coverage information.     Covid-19: Done 03/10/2020, 04/10/2020  Advanced directives: Advance directive discussed with you today. Even though you declined this today, please call our office should you change your mind, and we can give you the proper paperwork for you to fill out.   Conditions/risks identified: Aim for 30 minutes of exercise or brisk walking each day, drink 6-8 glasses of water and eat lots of fruits and vegetables.   Next appointment: Follow up in one year for your annual wellness visit 2023  Preventive Care 40-64 Years, Male Preventive care refers to lifestyle choices and visits with your health care provider that can promote health and wellness. What does preventive care include? A yearly physical exam. This is also called an annual well check. Dental exams once or twice a year. Routine eye exams. Ask your health care provider how often you should have your eyes checked. Personal lifestyle choices, including: Daily care of your teeth and gums. Regular physical activity. Eating a healthy diet. Avoiding tobacco and drug use. Limiting alcohol use. Practicing safe sex. Taking low-dose aspirin every day starting at age 56. What happens during an annual well check? The services and screenings done by your  health care provider during your annual well check will depend on your age, overall health, lifestyle risk factors, and family history of disease. Counseling  Your health care provider may ask you questions about your: Alcohol use. Tobacco use. Drug use. Emotional well-being. Home and relationship well-being. Sexual activity. Eating habits. Work and work Statistician. Screening  You may have the following tests or measurements: Height, weight, and BMI. Blood pressure. Lipid and cholesterol levels. These may be checked every 5 years, or more frequently if you are over 77 years old. Skin check. Lung cancer screening. You may have this screening every year starting at age 41 if you have a 30-pack-year history of smoking and currently smoke or have quit within the past 15 years. Fecal occult blood test (FOBT) of the stool. You may have this test every year starting at age 57. Flexible sigmoidoscopy or colonoscopy. You may have a sigmoidoscopy every 5 years or a colonoscopy every 10 years starting at age 69. Prostate cancer screening. Recommendations will vary depending on your family history and other risks. Hepatitis C blood test. Hepatitis B blood test. Sexually transmitted disease (STD) testing. Diabetes screening. This is done by checking your blood sugar (glucose) after you have not eaten for a while (fasting). You may have this done every 1-3 years. Discuss your test results, treatment options, and if necessary, the need for more tests with your health care provider. Vaccines  Your health care provider may recommend certain vaccines, such as: Influenza vaccine. This is recommended every year. Tetanus, diphtheria, and acellular pertussis (Tdap, Td) vaccine. You may need a Td booster every 10 years. Zoster vaccine. You may need this after age 44. Pneumococcal 13-valent conjugate (PCV13) vaccine. You  may need this if you have certain conditions and have not been vaccinated. Pneumococcal  polysaccharide (PPSV23) vaccine. You may need one or two doses if you smoke cigarettes or if you have certain conditions. Talk to your health care provider about which screenings and vaccines you need and how often you need them. This information is not intended to replace advice given to you by your health care provider. Make sure you discuss any questions you have with your health care provider. Document Released: 10/20/2015 Document Revised: 06/12/2016 Document Reviewed: 07/25/2015 Elsevier Interactive Patient Education  2017 [employer] Prevention in the Home Falls can cause injuries. They can happen to people of all ages. There are many things you can do to make your home safe and to help prevent falls. What can I do on the outside of my home? Regularly fix the edges of walkways and driveways and fix any cracks. Remove anything that might make you trip as you walk through a door, such as a raised step or threshold. Trim any bushes or trees on the path to your home. Use bright outdoor lighting. Clear any walking paths of anything that might make someone trip, such as rocks or tools. Regularly check to see if handrails are loose or broken. Make sure that both sides of any steps have handrails. Any raised decks and porches should have guardrails on the edges. Have any leaves, snow, or ice cleared regularly. Use sand or salt on walking paths during winter. Clean up any spills in your garage right away. This includes oil or grease spills. What can I do in the bathroom? Use night lights. Install grab bars by the toilet and in the tub and shower. Do not use towel bars as grab bars. Use non-skid mats or decals in the tub or shower. If you need to sit down in the shower, use a plastic, non-slip stool. Keep the floor dry. Clean up any water that spills on the floor as soon as it happens. Remove soap buildup in the tub or shower regularly. Attach bath mats securely with double-sided  non-slip rug tape. Do not have throw rugs and other things on the floor that can make you trip. What can I do in the bedroom? Use night lights. Make sure that you have a light by your bed that is easy to reach. Do not use any sheets or blankets that are too big for your bed. They should not hang down onto the floor. Have a firm chair that has side arms. You can use this for support while you get dressed. Do not have throw rugs and other things on the floor that can make you trip. What can I do in the kitchen? Clean up any spills right away. Avoid walking on wet floors. Keep items that you use a lot in easy-to-reach places. If you need to reach something above you, use a strong step stool that has a grab bar. Keep electrical cords out of the way. Do not use floor polish or wax that makes floors slippery. If you must use wax, use non-skid floor wax. Do not have throw rugs and other things on the floor that can make you trip. What can I do with my stairs? Do not leave any items on the stairs. Make sure that there are handrails on both sides of the stairs and use them. Fix handrails that are broken or loose. Make sure that handrails are as long as the stairways. Check any carpeting to make  sure that it is firmly attached to the stairs. Fix any carpet that is loose or worn. Avoid having throw rugs at the top or bottom of the stairs. If you do have throw rugs, attach them to the floor with carpet tape. Make sure that you have a light switch at the top of the stairs and the bottom of the stairs. If you do not have them, ask someone to add them for you. What else can I do to help prevent falls? Wear shoes that: Do not have high heels. Have rubber bottoms. Are comfortable and fit you well. Are closed at the toe. Do not wear sandals. If you use a stepladder: Make sure that it is fully opened. Do not climb a closed stepladder. Make sure that both sides of the stepladder are locked into place. Ask  someone to hold it for you, if possible. Clearly mark and make sure that you can see: Any grab bars or handrails. First and last steps. Where the edge of each step is. Use tools that help you move around (mobility aids) if they are needed. These include: Canes. Walkers. Scooters. Crutches. Turn on the lights when you go into a dark area. Replace any light bulbs as soon as they burn out. Set up your furniture so you have a clear path. Avoid moving your furniture around. If any of your floors are uneven, fix them. If there are any pets around you, be aware of where they are. Review your medicines with your doctor. Some medicines can make you feel dizzy. This can increase your chance of falling. Ask your doctor what other things that you can do to help prevent falls. This information is not intended to replace advice given to you by your health care provider. Make sure you discuss any questions you have with your health care provider. Document Released: 07/20/2009 Document Revised: 02/29/2016 Document Reviewed: 10/28/2014 Elsevier Interactive Patient Education  2017 [employer].

## 2021-07-12 ENCOUNTER — Ambulatory Visit (INDEPENDENT_AMBULATORY_CARE_PROVIDER_SITE_OTHER): Payer: Medicare Other

## 2021-07-12 ENCOUNTER — Encounter: Payer: Self-pay | Admitting: Internal Medicine

## 2021-07-12 ENCOUNTER — Other Ambulatory Visit: Payer: Self-pay

## 2021-07-12 ENCOUNTER — Ambulatory Visit (INDEPENDENT_AMBULATORY_CARE_PROVIDER_SITE_OTHER): Payer: Medicare Other | Admitting: Internal Medicine

## 2021-07-12 DIAGNOSIS — R6889 Other general symptoms and signs: Secondary | ICD-10-CM

## 2021-07-12 DIAGNOSIS — Z20822 Contact with and (suspected) exposure to covid-19: Secondary | ICD-10-CM

## 2021-07-12 DIAGNOSIS — U071 COVID-19: Secondary | ICD-10-CM

## 2021-07-12 LAB — POCT INFLUENZA A/B
Influenza A, POC: NEGATIVE
Influenza B, POC: NEGATIVE
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
## 2021-07-13 ENCOUNTER — Ambulatory Visit (HOSPITAL_COMMUNITY): Admission: RE | Admit: 2021-07-13 | Payer: Medicare Other | Source: Ambulatory Visit

## 2021-07-14 LAB — SARS-COV-2, NAA 2 DAY TAT

## 2021-07-14 LAB — NOVEL CORONAVIRUS, NAA: SARS-CoV-2, NAA: DETECTED — AB

## 2021-07-16 ENCOUNTER — Other Ambulatory Visit: Payer: Self-pay | Admitting: Internal Medicine

## 2021-07-16 MED ORDER — NIRMATRELVIR/RITONAVIR (PAXLOVID)TABLET: 3.0000 | ORAL_TABLET | Freq: Two times a day (BID) | ORAL | 0 refills | Status: AC

## 2021-07-16 NOTE — Addendum Note (Signed)
Addended byIhor Dow on: 07/16/2021 08:23 AM   Modules accepted: Orders

## 2021-08-05 ENCOUNTER — Other Ambulatory Visit: Payer: Self-pay | Admitting: Internal Medicine

## 2021-08-05 DIAGNOSIS — I10 Essential (primary) hypertension: Secondary | ICD-10-CM

## 2021-09-03 ENCOUNTER — Other Ambulatory Visit: Payer: Self-pay | Admitting: Internal Medicine

## 2021-09-03 DIAGNOSIS — I10 Essential (primary) hypertension: Secondary | ICD-10-CM

## 2021-10-15 ENCOUNTER — Other Ambulatory Visit: Payer: Self-pay | Admitting: Internal Medicine

## 2021-10-15 ENCOUNTER — Encounter (HOSPITAL_COMMUNITY): Payer: Self-pay

## 2021-10-15 ENCOUNTER — Other Ambulatory Visit (HOSPITAL_COMMUNITY): Payer: Self-pay

## 2021-10-15 DIAGNOSIS — I10 Essential (primary) hypertension: Secondary | ICD-10-CM

## 2021-10-15 DIAGNOSIS — Z87891 Personal history of nicotine dependence: Secondary | ICD-10-CM

## 2021-10-15 DIAGNOSIS — Z122 Encounter for screening for malignant neoplasm of respiratory organs: Secondary | ICD-10-CM

## 2021-10-15 NOTE — Progress Notes (Signed)
Received referral for initial lung cancer screening scan. Contacted patient and obtained smoking history (started age 66, current smoker smoking 1.5 to 2PPD for at least the past 15 years, prior to that smoked about 1PPD, 43 pack year) as well as answering questions related to the screening process. Patient denies signs/symptoms of lung cancer such as weight loss or hemoptysis. Patient denies comorbidity that would prevent curative treatment if lung cancer were to be found. Patient is scheduled for shared decision making visit and CT scan on 10/23/2021 at 1100.

## 2021-10-22 NOTE — Progress Notes (Signed)
Gary [name]  Visit Date: 10/22/21  Visit Type: In-Person at [place] DECISION-MAKING VISIT - Age: 66 y.o.  - Pack year smoking history: 73 pack-years (started age 21, current smoker smoking 1.5 to 2PPD for at least the past 15 years, prior to that smoked about 1PPD) - Type of tobacco abuse: Cigarettes - Current smoker or < 15 years of cessation: Current smoker (1.5-2 PPD) - No current symptoms of lung cancer:  Patient denies any hemoptysis, unintentional weight loss, and unexplained cough - Risks and benefits of lung cancer screening discussed: Negative: Over-diagnosis, radiation exposure, false positives, and additional testing Positive: Discover early stage lung cancer resulting in higher incidence of cure - Patient educated regarding the importance of adherence to continued lung cancer screening. - Currently, there are no co-morbidities to prevent treatment to therapy for lung cancer and the patient is agreeable to pursue treatment if a malignancy is discovered.  [place] Preventative Services Task Force recommend annual screening for lung cancer with low-dose CT in adults aged 60 - 78 years who have a 20+ pack year smoking history and currently smoke or have quit smoking within the past 15 years.  Screening should be discontinued once a person has not smoked for 15 years or develops a health problem that substantially limits life expectancy or the ability or willingness to have curative lung surgery.  It is a category B recommendation.  Similar stances are provided by CMS, NCCN, and AATS.  Social History   Tobacco Use   Smoking status: Every Day    Packs/day: 1.00    Years: 33.00    Pack years: 33.00    Types: Cigarettes   Smokeless tobacco: Never   Tobacco comments:    Down to 1/2 pk per day.   Vaping Use   Vaping Use: Never used  Substance Use Topics   Alcohol use: Not Currently    Alcohol/week: 0.0 standard drinks    Comment:  Reported drinks and uses marijuana occasionally   Drug use: Not Currently    Frequency: 7.0 times per week    Types: "Crack" cocaine, Cocaine, Marijuana    Comment: Last use 06/02/18     Personal history of tobacco use presenting hazards to health: - This patient meets criteria for low-dose CT lung cancer screening  - The shared decision making visit discussion included risks and benefits of screening, potential for follow-up, diagnostic testing for abnormal scans, potential for false positive tests, overdiagnosis, discussion about total radiation exposure - Patient stated willingness to undergo diagnostics and treatment as needed - Patient was counseled on smoking cessation to decrease the  risk of lung cancer, pulmonary disease, heart disease, and stroke - Patient has been referred to Lung Cancer Screening Nurse Coordinator for further scheduling of LDCT and for further resources regarding free nicotine replacement therapy and information about smoking cessation classes - Counseling on the importance of adherence to annual lung cancer LDCT screening, impact of co-morbidities, and ability or willingness to undergo diagnosis and treatment is imperative for compliance of the program. - Counseling on the importance of continued smoking cessation for former smokers; the importance of smoking cessation for current smokers, and information about tobacco cessation interventions have been given to patient including [place] and 1-800-quit [place] programs.  Yearly follow up will be coordinated by Adonis Huguenin, RN, MSN [facility] Oncology Nurse Navigator.)  Harriett Rush, PA-C  10/23/21 11:27 AM

## 2021-10-22 NOTE — Patient Instructions (Signed)
You were seen today for your shared-decision making visit with initial low-dose CT scan.  Thank you for your participation in this lifesaving program!  

## 2021-10-23 ENCOUNTER — Inpatient Hospital Stay (HOSPITAL_COMMUNITY): Payer: Medicare Other | Attending: Physician Assistant | Admitting: Physician Assistant

## 2021-10-23 ENCOUNTER — Other Ambulatory Visit: Payer: Self-pay

## 2021-10-23 ENCOUNTER — Ambulatory Visit (HOSPITAL_COMMUNITY)
Admission: RE | Admit: 2021-10-23 | Discharge: 2021-10-23 | Disposition: A | Discharge status: Home / Self Care | Payer: Medicare Other | Source: Ambulatory Visit | Attending: Physician Assistant | Admitting: Physician Assistant

## 2021-10-23 DIAGNOSIS — Z87891 Personal history of nicotine dependence: Secondary | ICD-10-CM | POA: Insufficient documentation

## 2021-10-23 DIAGNOSIS — F17210 Nicotine dependence, cigarettes, uncomplicated: Secondary | ICD-10-CM | POA: Diagnosis not present

## 2021-10-23 DIAGNOSIS — Z122 Encounter for screening for malignant neoplasm of respiratory organs: Secondary | ICD-10-CM | POA: Diagnosis not present

## 2021-10-25 ENCOUNTER — Encounter (HOSPITAL_COMMUNITY): Payer: Self-pay

## 2021-10-25 NOTE — Progress Notes (Signed)
Patient notified of LDCT Lung Cancer Screening Results via mail with the recommendation to follow-up in 12 months. Patient's referring provider has been sent a copy of results. Results are as follows:  IMPRESSION: 1. Lung-RADS 2S, benign appearance or behavior. Continue annual screening with low-dose chest CT without contrast in 12 months. S modifier for coronary artery calcifications. 2. Coronary artery calcifications of the LAD, recommend ASCVD risk assessment. 3. Aortic Atherosclerosis (ICD10-I70.0) and Emphysema (ICD10-J43.9).

## 2021-11-07 ENCOUNTER — Telehealth: Payer: Self-pay | Admitting: Internal Medicine

## 2021-11-07 ENCOUNTER — Other Ambulatory Visit: Payer: Self-pay | Admitting: *Deleted

## 2021-11-07 ENCOUNTER — Other Ambulatory Visit: Payer: Self-pay | Admitting: Internal Medicine

## 2021-11-07 DIAGNOSIS — G4700 Insomnia, unspecified: Secondary | ICD-10-CM

## 2021-11-07 MED ORDER — FLUTICASONE FUROATE-VILANTEROL 100-25 MCG/ACT IN AEPB: 1.0000 | INHALATION_SPRAY | Freq: Every day | RESPIRATORY_TRACT | 11 refills | Status: DC

## 2021-11-07 MED ORDER — PROAIR HFA 108 (90 BASE) MCG/ACT IN AERS: INHALATION_SPRAY | RESPIRATORY_TRACT | 11 refills | Status: DC

## 2021-11-07 MED ORDER — TRAZODONE HCL 100 MG PO TABS: 100.0000 mg | ORAL_TABLET | Freq: Every day | ORAL | 1 refills | Status: DC

## 2021-11-07 NOTE — Telephone Encounter (Signed)
Pt came by office for refills   PROAIR HFA 108 (90 Base) MCG/ACT inhaler   fluticasone furoate-vilanterol (BREO ELLIPTA) 100-25 MCG/INH AEPB    traZODone (DESYREL) 100 MG tablet

## 2021-11-15 ENCOUNTER — Other Ambulatory Visit: Payer: Self-pay | Admitting: Internal Medicine

## 2021-11-15 DIAGNOSIS — I10 Essential (primary) hypertension: Secondary | ICD-10-CM

## 2021-11-22 ENCOUNTER — Other Ambulatory Visit: Payer: Self-pay | Admitting: Internal Medicine

## 2021-11-22 DIAGNOSIS — I10 Essential (primary) hypertension: Secondary | ICD-10-CM

## 2021-12-24 ENCOUNTER — Other Ambulatory Visit: Payer: Self-pay | Admitting: *Deleted

## 2021-12-24 ENCOUNTER — Encounter: Payer: Self-pay | Admitting: Internal Medicine

## 2021-12-24 ENCOUNTER — Ambulatory Visit (INDEPENDENT_AMBULATORY_CARE_PROVIDER_SITE_OTHER): Payer: Medicare Other | Admitting: Internal Medicine

## 2021-12-24 ENCOUNTER — Other Ambulatory Visit: Payer: Self-pay

## 2021-12-24 VITALS — BP 138/82 | HR 57 | Resp 18 | Ht 73.0 in | Wt 183.0 lb

## 2021-12-24 DIAGNOSIS — E782 Mixed hyperlipidemia: Secondary | ICD-10-CM | POA: Diagnosis not present

## 2021-12-24 DIAGNOSIS — J42 Unspecified chronic bronchitis: Secondary | ICD-10-CM

## 2021-12-24 DIAGNOSIS — Z0001 Encounter for general adult medical examination with abnormal findings: Secondary | ICD-10-CM

## 2021-12-24 DIAGNOSIS — F17210 Nicotine dependence, cigarettes, uncomplicated: Secondary | ICD-10-CM | POA: Diagnosis not present

## 2021-12-24 DIAGNOSIS — I10 Essential (primary) hypertension: Secondary | ICD-10-CM

## 2021-12-24 DIAGNOSIS — I739 Peripheral vascular disease, unspecified: Secondary | ICD-10-CM

## 2021-12-24 DIAGNOSIS — Z125 Encounter for screening for malignant neoplasm of prostate: Secondary | ICD-10-CM | POA: Insufficient documentation

## 2021-12-24 DIAGNOSIS — Z720 Tobacco use: Secondary | ICD-10-CM

## 2021-12-24 DIAGNOSIS — E559 Vitamin D deficiency, unspecified: Secondary | ICD-10-CM | POA: Diagnosis not present

## 2021-12-24 DIAGNOSIS — K130 Diseases of lips: Secondary | ICD-10-CM

## 2021-12-24 MED ORDER — TELMISARTAN 20 MG PO TABS: ORAL_TABLET | ORAL | 1 refills | Status: DC

## 2021-12-24 MED ORDER — MUPIROCIN 2 % EX OINT: 1.0000 "application " | TOPICAL_OINTMENT | Freq: Two times a day (BID) | CUTANEOUS | 0 refills | Status: DC

## 2021-12-24 NOTE — Assessment & Plan Note (Signed)
Well-controlled with Breo and as needed albuterol 

## 2021-12-24 NOTE — Assessment & Plan Note (Signed)
Ordered PSA after discussing its limitations for prostate cancer screening, including false positive results leading additional investigations. 
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
## 2021-12-24 NOTE — Patient Instructions (Signed)
Please continue to take medications as prescribed. ? ?Please continue to follow low salt diet and perform moderate exercise/walking at least 150 mins/week. ? ?Please try to cut down -> quit smoking. ? ?Please consider getting Shingrix vaccine at your local pharmacy. ?

## 2021-12-24 NOTE — Assessment & Plan Note (Signed)
Physical exam as documented. ?Fasting blood test ordered today. ?Advised to get Shingrix vaccine at local pharmacy. ?

## 2021-12-24 NOTE — Assessment & Plan Note (Signed)
Smokes about 1 pack/day now ? ?Asked about quitting: confirms that he currently smokes cigarettes ?Advise to quit smoking: Educated about QUITTING to reduce the risk of cancer, cardio and cerebrovascular disease. ?Assess willingness: Unwilling to quit at this time, but is working on cutting back. ?Assist with counseling and pharmacotherapy: Counseled for 5 minutes and literature provided. ?Arrange for follow up: Follow up in 3 months and continue to offer help. ?

## 2021-12-24 NOTE — Assessment & Plan Note (Signed)
S/p b/l femoral artery bypass graft On Crestor 10 mg QD On Aspirin 

## 2021-12-24 NOTE — Assessment & Plan Note (Signed)
BP Readings from Last 1 Encounters:  ?12/24/21 138/82  ? ?Well-controlled ?Continue Telmisartan 20 mg QD ?Counseled for compliance with the medications ?Advised DASH diet and moderate exercise/walking, at least 150 mins/week ?

## 2021-12-25 LAB — CMP14+EGFR
ALT: 18 IU/L (ref 0–44)
AST: 21 IU/L (ref 0–40)
Albumin/Globulin Ratio: 1.6 (ref 1.2–2.2)
Albumin: 4.6 g/dL (ref 3.8–4.8)
Alkaline Phosphatase: 46 IU/L (ref 44–121)
BUN/Creatinine Ratio: 11 (ref 10–24)
BUN: 12 mg/dL (ref 8–27)
Bilirubin Total: 0.4 mg/dL (ref 0.0–1.2)
CO2: 26 mmol/L (ref 20–29)
Calcium: 9.8 mg/dL (ref 8.6–10.2)
Chloride: 100 mmol/L (ref 96–106)
Creatinine, Ser: 1.07 mg/dL (ref 0.76–1.27)
Globulin, Total: 2.9 g/dL (ref 1.5–4.5)
Glucose: 93 mg/dL (ref 70–99)
Potassium: 5.1 mmol/L (ref 3.5–5.2)
Sodium: 140 mmol/L (ref 134–144)
Total Protein: 7.5 g/dL (ref 6.0–8.5)
eGFR: 77 mL/min/{1.73_m2} (ref >59)

## 2021-12-25 LAB — LIPID PANEL
Chol/HDL Ratio: 3.9 ratio (ref 0.0–5.0)
Cholesterol, Total: 160 mg/dL (ref 100–199)
HDL: 41 mg/dL (ref >39)
LDL Chol Calc (NIH): 83 mg/dL (ref 0–99)
Triglycerides: 216 mg/dL — ABNORMAL HIGH (ref 0–149)
VLDL Cholesterol Cal: 36 mg/dL (ref 5–40)

## 2021-12-25 LAB — CBC WITH DIFFERENTIAL/PLATELET
Basophils Absolute: 0.1 10*3/uL (ref 0.0–0.2)
Basos: 1 %
EOS (ABSOLUTE): 0.5 10*3/uL — ABNORMAL HIGH (ref 0.0–0.4)
Eos: 6 %
Hematocrit: 44.1 % (ref 37.5–51.0)
Hemoglobin: 14.8 g/dL (ref 13.0–17.7)
Immature Grans (Abs): 0 10*3/uL (ref 0.0–0.1)
Immature Granulocytes: 0 %
Lymphocytes Absolute: 2.2 10*3/uL (ref 0.7–3.1)
Lymphs: 28 %
MCH: 29.2 pg (ref 26.6–33.0)
MCHC: 33.6 g/dL (ref 31.5–35.7)
MCV: 87 fL (ref 79–97)
Monocytes Absolute: 0.5 10*3/uL (ref 0.1–0.9)
Monocytes: 6 %
Neutrophils Absolute: 4.7 10*3/uL (ref 1.4–7.0)
Neutrophils: 59 %
Platelets: 221 10*3/uL (ref 150–450)
RBC: 5.06 x10E6/uL (ref 4.14–5.80)
RDW: 12 % (ref 11.6–15.4)
WBC: 7.9 10*3/uL (ref 3.4–10.8)

## 2021-12-25 LAB — TSH: TSH: 2.13 u[IU]/mL (ref 0.450–4.500)

## 2021-12-25 LAB — HEMOGLOBIN A1C
Est. average glucose Bld gHb Est-mCnc: 108 mg/dL
Hgb A1c MFr Bld: 5.4 % (ref 4.8–5.6)

## 2021-12-25 LAB — PSA: Prostate Specific Ag, Serum: 0.6 ng/mL (ref 0.0–4.0)

## 2021-12-25 LAB — VITAMIN D 25 HYDROXY (VIT D DEFICIENCY, FRACTURES): Vit D, 25-Hydroxy: 47.3 ng/mL (ref 30.0–100.0)

## 2022-05-10 IMAGING — CT CT CHEST LUNG CANCER SCREENING LOW DOSE W/O CM
1 series · 10 of 10 positions shown, 13 images · non-contrast
Comparison: None.

CLINICAL DATA: Current smoker with 40 pack-year history



[ct lung segmentation data · axial · 0.67mm/px · z∈[+1199,+1199]mm · 10 of 354 frames shown]
[frame 1/354  mediastinal]
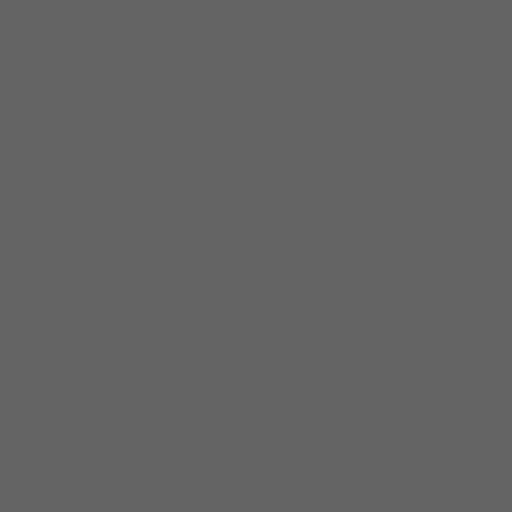
[frame 1/354  lung]
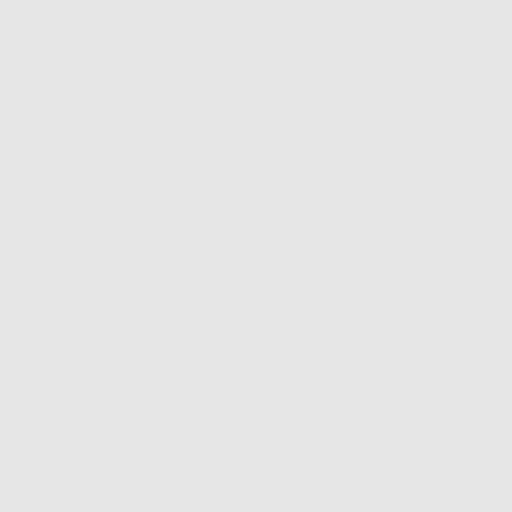
[frame 40/354  lung]
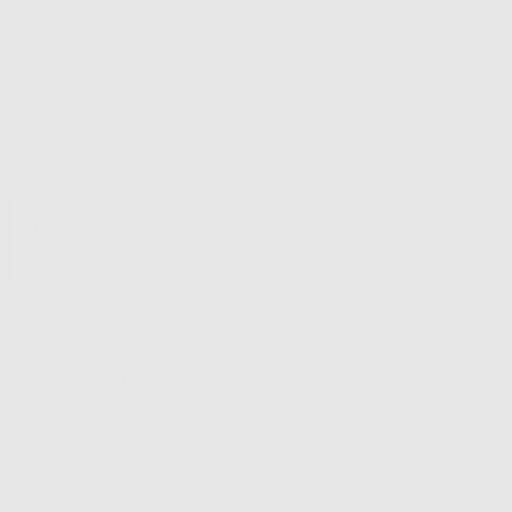
[frame 79/354  lung]
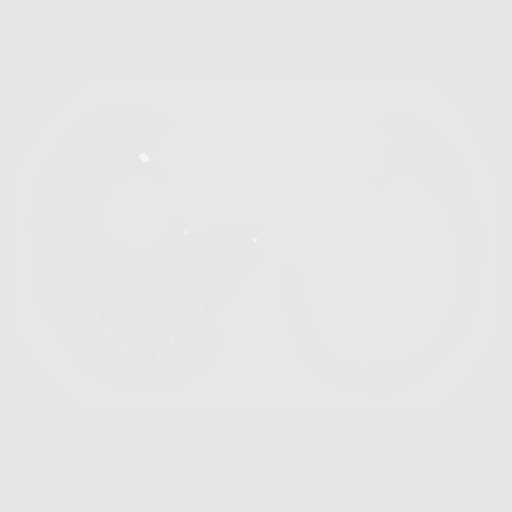
[frame 118/354  lung]
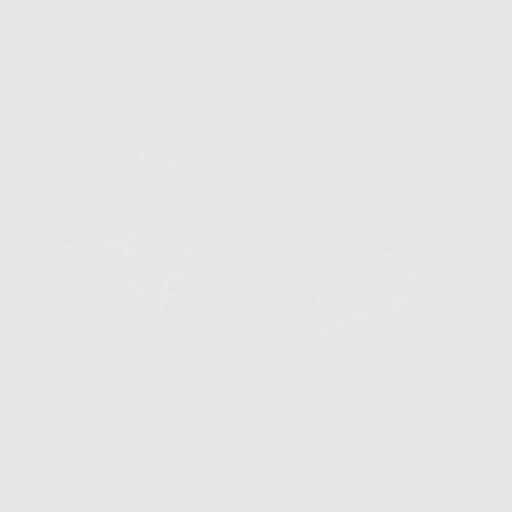
[frame 157/354  mediastinal]
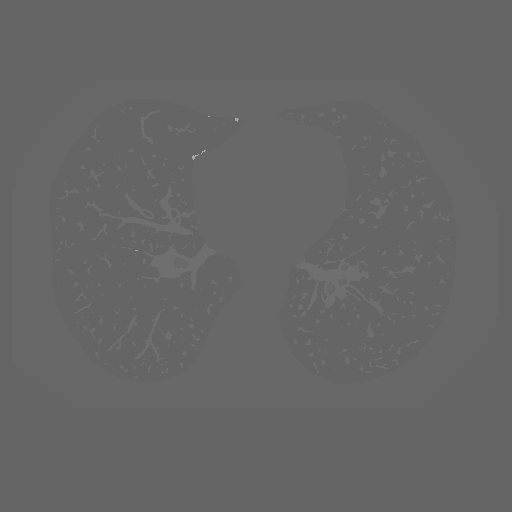
[frame 157/354  lung]
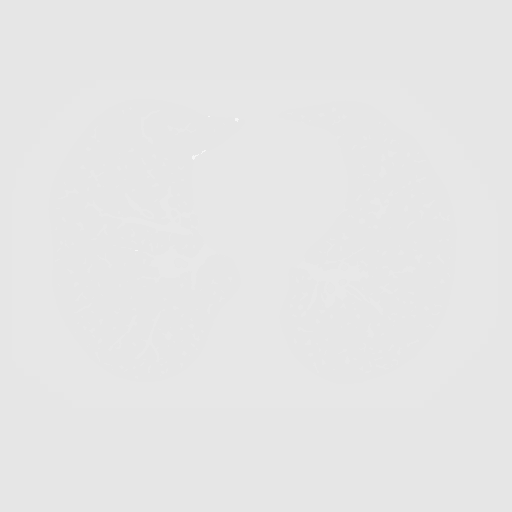
[frame 197/354  lung]
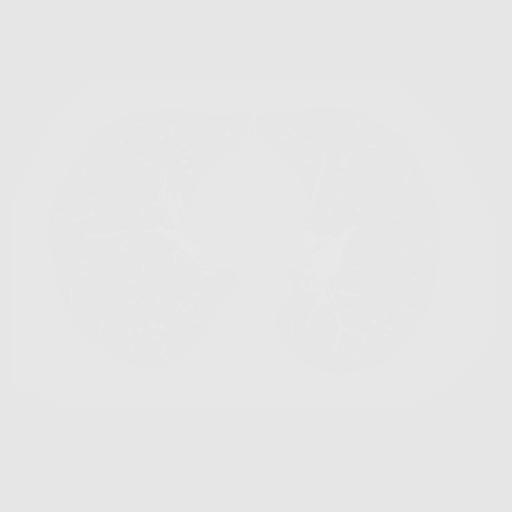
[frame 236/354  lung]
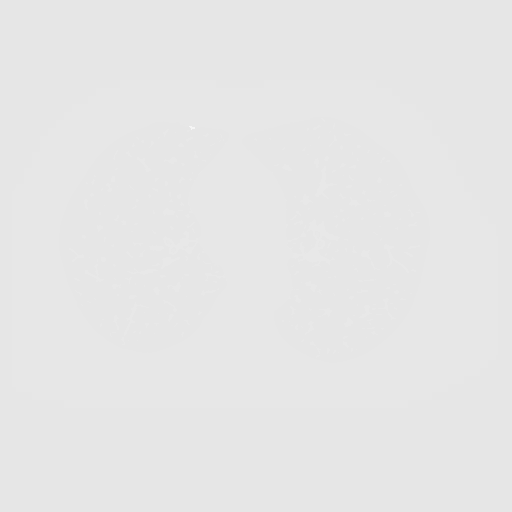
[frame 275/354  lung]
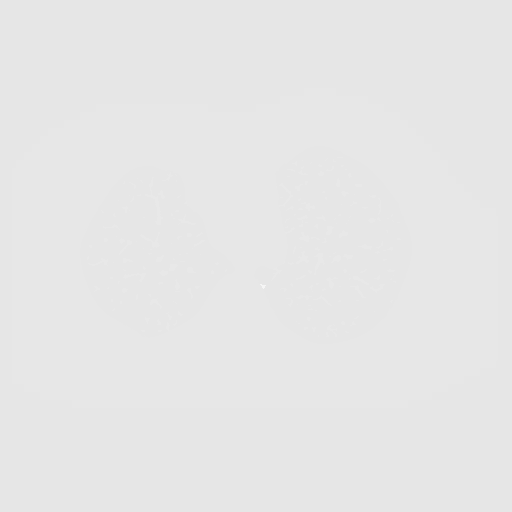
[frame 314/354  mediastinal]
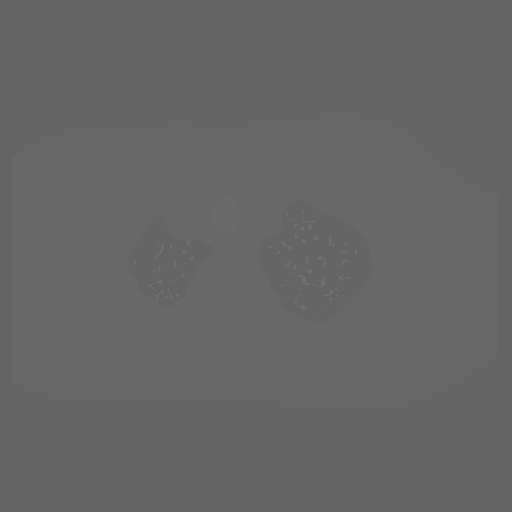
[frame 314/354  lung]
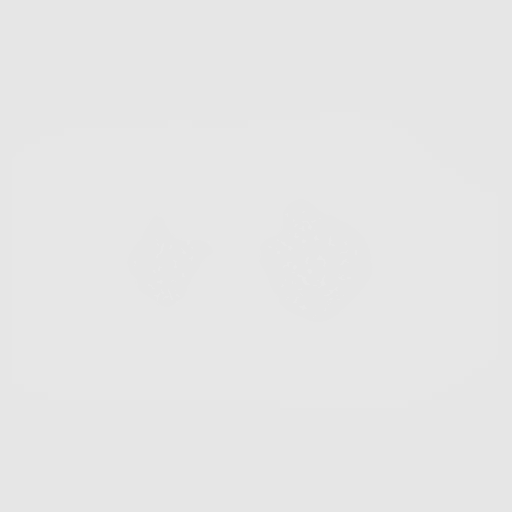
[frame 354/354  lung]
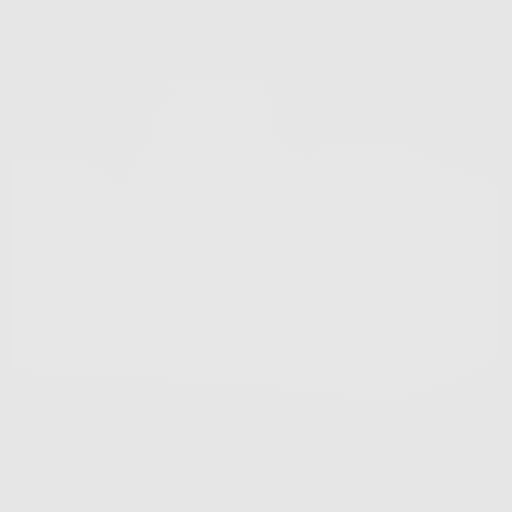

[10 of 10 positions shown; findings below may reference images not displayed]

FINDINGS: Cardiovascular: Normal heart size. No pericardial effusion. Coronary
artery calcifications of the LAD. Atherosclerotic disease of the
thoracic aorta.

Mediastinum/Nodes: No pathologically enlarged lymph nodes seen in
the chest. Esophagus is unremarkable.

Lungs/Pleura: Central airways are patent. Centrilobular emphysema.
No consolidation, pleural effusion or pneumothorax. Small solid
pulmonary nodules. Reference nodule of the right upper lobe
measuring 4.3 mm in mean diameter on image 128.

Upper Abdomen: No acute abnormality.

Musculoskeletal: No chest wall mass or suspicious bone lesions
identified.
IMPRESSION: 1. Lung-RADS 2S, benign appearance or behavior. Continue annual
screening with low-dose chest CT without contrast in 12 months. S
modifier for coronary artery calcifications.
2. Coronary artery calcifications of the LAD, recommend ASCVD risk
assessment.
3. Aortic Atherosclerosis (D3UCJ-JQ9.9) and Emphysema ([id number]-[id number].L).

## 2022-05-15 ENCOUNTER — Other Ambulatory Visit: Payer: Self-pay | Admitting: Internal Medicine

## 2022-05-15 DIAGNOSIS — G4700 Insomnia, unspecified: Secondary | ICD-10-CM

## 2022-05-19 ENCOUNTER — Other Ambulatory Visit: Payer: Self-pay

## 2022-05-19 ENCOUNTER — Emergency Department (HOSPITAL_COMMUNITY): Payer: Medicare Other

## 2022-05-19 ENCOUNTER — Encounter (HOSPITAL_COMMUNITY): Payer: Self-pay

## 2022-05-19 ENCOUNTER — Emergency Department (HOSPITAL_COMMUNITY)
Admission: EM | Admit: 2022-05-19 | Discharge: 2022-05-19 | Disposition: A | Discharge status: Home / Self Care | Payer: Medicare Other | Attending: Emergency Medicine | Admitting: Emergency Medicine

## 2022-05-19 DIAGNOSIS — Z7951 Long term (current) use of inhaled steroids: Secondary | ICD-10-CM | POA: Diagnosis not present

## 2022-05-19 DIAGNOSIS — I10 Essential (primary) hypertension: Secondary | ICD-10-CM | POA: Insufficient documentation

## 2022-05-19 DIAGNOSIS — Z79899 Other long term (current) drug therapy: Secondary | ICD-10-CM | POA: Diagnosis not present

## 2022-05-19 DIAGNOSIS — M5442 Lumbago with sciatica, left side: Secondary | ICD-10-CM | POA: Diagnosis not present

## 2022-05-19 DIAGNOSIS — J449 Chronic obstructive pulmonary disease, unspecified: Secondary | ICD-10-CM | POA: Insufficient documentation

## 2022-05-19 DIAGNOSIS — M545 Low back pain, unspecified: Secondary | ICD-10-CM | POA: Diagnosis not present

## 2022-05-19 LAB — CBC WITH DIFFERENTIAL/PLATELET
Abs Immature Granulocytes: 0.02 10*3/uL (ref 0.00–0.07)
Basophils Absolute: 0.1 10*3/uL (ref 0.0–0.1)
Basophils Relative: 1 %
Eosinophils Absolute: 0.3 10*3/uL (ref 0.0–0.5)
Eosinophils Relative: 3 %
HCT: 43.5 % (ref 39.0–52.0)
Hemoglobin: 14.6 g/dL (ref 13.0–17.0)
Immature Granulocytes: 0 %
Lymphocytes Relative: 25 %
Lymphs Abs: 2 10*3/uL (ref 0.7–4.0)
MCH: 30.6 pg (ref 26.0–34.0)
MCHC: 33.6 g/dL (ref 30.0–36.0)
MCV: 91.2 fL (ref 80.0–100.0)
Monocytes Absolute: 0.6 10*3/uL (ref 0.1–1.0)
Monocytes Relative: 7 %
Neutro Abs: 5.2 10*3/uL (ref 1.7–7.7)
Neutrophils Relative %: 64 %
Platelets: 182 10*3/uL (ref 150–400)
RBC: 4.77 MIL/uL (ref 4.22–5.81)
RDW: 12.2 % (ref 11.5–15.5)
WBC: 8.1 10*3/uL (ref 4.0–10.5)
nRBC: 0 % (ref 0.0–0.2)

## 2022-05-19 LAB — BASIC METABOLIC PANEL
Anion gap: 8 (ref 5–15)
BUN: 15 mg/dL (ref 8–23)
CO2: 26 mmol/L (ref 22–32)
Calcium: 9.3 mg/dL (ref 8.9–10.3)
Chloride: 104 mmol/L (ref 98–111)
Creatinine, Ser: 0.94 mg/dL (ref 0.61–1.24)
GFR, Estimated: >60 mL/min (ref >60)
Glucose, Bld: 103 mg/dL — ABNORMAL HIGH (ref 70–99)
Potassium: 4.6 mmol/L (ref 3.5–5.1)
Sodium: 138 mmol/L (ref 135–145)

## 2022-05-19 MED ORDER — KETOROLAC TROMETHAMINE 30 MG/ML IJ SOLN
30.0000 mg | Freq: Once | INTRAMUSCULAR | Status: AC
  Administered 2022-05-19: 30 mg via INTRAMUSCULAR
  Filled 2022-05-19: qty 1

## 2022-05-19 MED ORDER — OXYCODONE-ACETAMINOPHEN 5-325 MG PO TABS: 1.0000 | ORAL_TABLET | ORAL | 0 refills | Status: DC | PRN

## 2022-05-19 MED ORDER — KETOROLAC TROMETHAMINE 30 MG/ML IJ SOLN: 30.0000 mg | Freq: Once | INTRAMUSCULAR | Status: DC

## 2022-05-19 MED ORDER — HYDROMORPHONE HCL 1 MG/ML IJ SOLN: 1.0000 mg | Freq: Once | INTRAMUSCULAR | Status: DC

## 2022-05-19 MED ORDER — HYDROMORPHONE HCL 1 MG/ML IJ SOLN
1.0000 mg | Freq: Once | INTRAMUSCULAR | Status: AC
  Administered 2022-05-19: 1 mg via INTRAMUSCULAR
  Filled 2022-05-19: qty 1

## 2022-05-19 MED ORDER — PREDNISONE 10 MG PO TABS: ORAL_TABLET | ORAL | 0 refills | Status: DC

## 2022-05-19 NOTE — Discharge Instructions (Addendum)
Avoid bending or twisting movements or heavy lifting for at least 1 week.  Take the medication as directed.  I also recommend that you try an over-the-counter lidocaine patch applied to the affected area.  The lidocaine patches are 4% and you can purchase these without a prescription.  Follow-up with your preferred pain management or neurosurgeon.  I have also listed neurosurgery in [place] to contact to arrange appt with if needed.  Return to the emergency department for any new or worsening symptoms.
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
## 2022-05-19 NOTE — ED Triage Notes (Signed)
"  Was helping my son put down a floor a few weeks ago and my back has been giving me a fit every since. Now it is going in to my left hip and down my leg. History of back surgery for ruptured disk many years ago, ?15 years ago maybe" per pt  Denies dysuria, denies any trauma or other symptoms.

## 2022-05-29 DIAGNOSIS — M461 Sacroiliitis, not elsewhere classified: Secondary | ICD-10-CM | POA: Diagnosis not present

## 2022-05-29 DIAGNOSIS — G894 Chronic pain syndrome: Secondary | ICD-10-CM | POA: Diagnosis not present

## 2022-05-29 DIAGNOSIS — M5416 Radiculopathy, lumbar region: Secondary | ICD-10-CM | POA: Diagnosis not present

## 2022-06-20 DIAGNOSIS — M795 Residual foreign body in soft tissue: Secondary | ICD-10-CM | POA: Diagnosis not present

## 2022-06-23 DIAGNOSIS — M4726 Other spondylosis with radiculopathy, lumbar region: Secondary | ICD-10-CM | POA: Diagnosis not present

## 2022-06-23 DIAGNOSIS — M48061 Spinal stenosis, lumbar region without neurogenic claudication: Secondary | ICD-10-CM | POA: Diagnosis not present

## 2022-06-26 ENCOUNTER — Ambulatory Visit (INDEPENDENT_AMBULATORY_CARE_PROVIDER_SITE_OTHER): Payer: Medicare Other | Admitting: Family Medicine

## 2022-06-26 ENCOUNTER — Encounter: Payer: Self-pay | Admitting: Family Medicine

## 2022-06-26 VITALS — BP 138/69 | HR 68 | Ht 73.0 in | Wt 173.0 lb

## 2022-06-26 DIAGNOSIS — J449 Chronic obstructive pulmonary disease, unspecified: Secondary | ICD-10-CM

## 2022-06-26 DIAGNOSIS — I10 Essential (primary) hypertension: Secondary | ICD-10-CM | POA: Diagnosis not present

## 2022-06-26 DIAGNOSIS — E782 Mixed hyperlipidemia: Secondary | ICD-10-CM

## 2022-06-26 DIAGNOSIS — Z23 Encounter for immunization: Secondary | ICD-10-CM

## 2022-06-26 DIAGNOSIS — F330 Major depressive disorder, recurrent, mild: Secondary | ICD-10-CM

## 2022-06-26 DIAGNOSIS — G4700 Insomnia, unspecified: Secondary | ICD-10-CM

## 2022-06-26 MED ORDER — TRAZODONE HCL 100 MG PO TABS: ORAL_TABLET | ORAL | 0 refills | Status: DC

## 2022-06-26 MED ORDER — PROAIR HFA 108 (90 BASE) MCG/ACT IN AERS: INHALATION_SPRAY | RESPIRATORY_TRACT | 11 refills | Status: DC

## 2022-06-26 MED ORDER — ROSUVASTATIN CALCIUM 10 MG PO TABS: 5.0000 mg | ORAL_TABLET | Freq: Every day | ORAL | 1 refills | Status: DC

## 2022-06-26 MED ORDER — FLUTICASONE FUROATE-VILANTEROL 100-25 MCG/ACT IN AEPB: 1.0000 | INHALATION_SPRAY | Freq: Every day | RESPIRATORY_TRACT | 11 refills | Status: DC

## 2022-06-26 MED ORDER — TELMISARTAN 20 MG PO TABS: ORAL_TABLET | ORAL | 1 refills | Status: DC

## 2022-06-26 NOTE — Patient Instructions (Addendum)
I appreciate the opportunity to provide care to you today!    Follow up:  3 months   Please pick up your refills at the pharmacy    Please continue to a heart-healthy diet and increase your physical activities. Try to exercise for 30mins at least three times a week.      It was a pleasure to see you and I look forward to continuing to work together on your health and well-being. Please do not hesitate to call the office if you need care or have questions about your care.   Have a wonderful day and week. With Gratitude, Anahi Belmar MSN, FNP-BC  
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
## 2022-06-27 DIAGNOSIS — Z23 Encounter for immunization: Secondary | ICD-10-CM | POA: Insufficient documentation

## 2022-06-27 NOTE — Assessment & Plan Note (Signed)
Stable  His PHQ-9 is O He denies SI and HI

## 2022-06-27 NOTE — Assessment & Plan Note (Signed)
Controlled  Denies headache, dizziness, chest pain, dyspnea or palpitations Refilled Elisa 20 mg daily

## 2022-06-27 NOTE — Assessment & Plan Note (Signed)
He has been out of Breo for 2-3 weeks Refilled Breo Denies symptoms of exacerbations

## 2022-06-27 NOTE — Assessment & Plan Note (Signed)
Patient educated on CDC recommendation for the vaccine. Verbal consent was obtained from the patient, vaccine administered by nurse, no sign of adverse reactions noted at this time. Patient education on arm soreness and use of tylenol or ibuprofen for this patient  was discussed. Patient educated on the signs and symptoms of adverse effect and advise to contact the office if they occur.  

## 2022-07-05 ENCOUNTER — Encounter (HOSPITAL_COMMUNITY): Payer: Self-pay | Admitting: Physical Therapy

## 2022-07-05 ENCOUNTER — Ambulatory Visit (HOSPITAL_COMMUNITY): Payer: Medicare Other | Attending: Internal Medicine | Admitting: Physical Therapy

## 2022-07-05 DIAGNOSIS — R262 Difficulty in walking, not elsewhere classified: Secondary | ICD-10-CM | POA: Diagnosis not present

## 2022-07-05 DIAGNOSIS — M5459 Other low back pain: Secondary | ICD-10-CM | POA: Insufficient documentation
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
## 2022-07-08 ENCOUNTER — Telehealth (HOSPITAL_COMMUNITY): Payer: Self-pay | Admitting: Physical Therapy

## 2022-07-08 ENCOUNTER — Encounter (HOSPITAL_COMMUNITY): Payer: Medicare Other | Admitting: Physical Therapy

## 2022-07-08 NOTE — Telephone Encounter (Signed)
No show #1 - no voicemail available at provided number.  Candie Mile, PT, DPT Physical Therapist Acute Rehabilitation Services [place] [facility]

## 2022-07-09 DIAGNOSIS — M5416 Radiculopathy, lumbar region: Secondary | ICD-10-CM | POA: Diagnosis not present

## 2022-07-10 DIAGNOSIS — M5416 Radiculopathy, lumbar region: Secondary | ICD-10-CM | POA: Diagnosis not present

## 2022-07-10 DIAGNOSIS — M545 Low back pain, unspecified: Secondary | ICD-10-CM | POA: Diagnosis not present

## 2022-07-11 ENCOUNTER — Ambulatory Visit: Payer: Medicare Other

## 2022-07-11 ENCOUNTER — Ambulatory Visit (HOSPITAL_COMMUNITY): Payer: Medicare Other | Attending: Internal Medicine | Admitting: Physical Therapy

## 2022-07-11 ENCOUNTER — Encounter (HOSPITAL_COMMUNITY): Payer: Self-pay | Admitting: Physical Therapy

## 2022-07-11 DIAGNOSIS — R262 Difficulty in walking, not elsewhere classified: Secondary | ICD-10-CM | POA: Diagnosis not present

## 2022-07-11 DIAGNOSIS — M5459 Other low back pain: Secondary | ICD-10-CM | POA: Diagnosis not present
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
## 2022-07-15 ENCOUNTER — Telehealth (HOSPITAL_COMMUNITY): Payer: Self-pay | Admitting: Physical Therapy

## 2022-07-15 ENCOUNTER — Encounter (HOSPITAL_COMMUNITY): Payer: Medicare Other | Admitting: Physical Therapy

## 2022-07-15 NOTE — Telephone Encounter (Signed)
Spoke with patient - got appointments confused and thought he was supposed to have PT tomorrow. He is able to make his Friday Appointment this week and plans to be here.  Candie Mile, PT, DPT Physical Therapist Acute Rehabilitation Services [facility] [facility]

## 2022-07-19 ENCOUNTER — Encounter (HOSPITAL_COMMUNITY): Payer: Medicare Other

## 2022-07-23 ENCOUNTER — Encounter: Payer: Self-pay | Admitting: Internal Medicine

## 2022-07-23 ENCOUNTER — Ambulatory Visit (INDEPENDENT_AMBULATORY_CARE_PROVIDER_SITE_OTHER): Payer: Medicare Other | Admitting: Internal Medicine

## 2022-07-23 DIAGNOSIS — Z0000 Encounter for general adult medical examination without abnormal findings: Secondary | ICD-10-CM | POA: Diagnosis not present
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
## 2022-07-23 NOTE — Patient Instructions (Signed)
  Gary [name] , Thank you for taking time to come for your Medicare Wellness Visit. I appreciate your ongoing commitment to your health goals. Please review the following plan we discussed and let me know if I can assist you in the future.   These are the goals we discussed:  This is a list of the screening recommended for you and due dates:  Health Maintenance  Topic Date Due   Zoster (Shingles) Vaccine (1 of 2) Never done   COVID-19 Vaccine (3 - Moderna series) 06/05/2020   Colon Cancer Screening  04/16/2025   Tetanus Vaccine  06/12/2028   Flu Shot  Completed   Hepatitis C Screening: USPSTF Recommendation to screen - Ages 18-79 yo.  Completed   HIV Screening  Completed   HPV Vaccine  Aged Out

## 2022-07-24 ENCOUNTER — Encounter (HOSPITAL_COMMUNITY): Payer: Medicare Other

## 2022-07-26 ENCOUNTER — Encounter (HOSPITAL_COMMUNITY): Payer: Medicare Other | Admitting: Physical Therapy

## 2022-07-31 ENCOUNTER — Encounter (HOSPITAL_COMMUNITY): Payer: Medicare Other

## 2022-08-02 ENCOUNTER — Encounter (HOSPITAL_COMMUNITY): Payer: Medicare Other

## 2022-08-06 ENCOUNTER — Encounter (HOSPITAL_COMMUNITY): Payer: Medicare Other

## 2022-08-08 ENCOUNTER — Encounter (HOSPITAL_COMMUNITY): Payer: Medicare Other

## 2022-08-12 ENCOUNTER — Encounter (HOSPITAL_COMMUNITY): Payer: Medicare Other | Admitting: Physical Therapy

## 2022-08-14 ENCOUNTER — Encounter (HOSPITAL_COMMUNITY): Payer: Medicare Other

## 2022-08-15 DIAGNOSIS — I10 Essential (primary) hypertension: Secondary | ICD-10-CM | POA: Diagnosis not present

## 2022-08-15 DIAGNOSIS — M545 Low back pain, unspecified: Secondary | ICD-10-CM | POA: Diagnosis not present

## 2022-08-15 DIAGNOSIS — J449 Chronic obstructive pulmonary disease, unspecified: Secondary | ICD-10-CM | POA: Diagnosis not present

## 2022-08-15 DIAGNOSIS — M48062 Spinal stenosis, lumbar region with neurogenic claudication: Secondary | ICD-10-CM | POA: Diagnosis not present

## 2022-08-15 DIAGNOSIS — Z79899 Other long term (current) drug therapy: Secondary | ICD-10-CM | POA: Diagnosis not present

## 2022-09-25 ENCOUNTER — Ambulatory Visit: Payer: Medicare Other | Admitting: Internal Medicine

## 2022-10-10 ENCOUNTER — Other Ambulatory Visit: Payer: Self-pay

## 2022-10-10 DIAGNOSIS — Z122 Encounter for screening for malignant neoplasm of respiratory organs: Secondary | ICD-10-CM

## 2022-10-10 DIAGNOSIS — Z87891 Personal history of nicotine dependence: Secondary | ICD-10-CM

## 2022-10-10 NOTE — Progress Notes (Signed)
LDCT order placed per protocol. Patient scheduled for LDCT on 02/21 at 1445.

## 2022-10-22 ENCOUNTER — Encounter: Payer: Self-pay | Admitting: Internal Medicine

## 2022-10-22 ENCOUNTER — Ambulatory Visit (INDEPENDENT_AMBULATORY_CARE_PROVIDER_SITE_OTHER): Payer: 59 | Admitting: Internal Medicine

## 2022-10-22 VITALS — BP 129/74 | HR 67 | Ht 73.0 in | Wt 179.8 lb

## 2022-10-22 DIAGNOSIS — J449 Chronic obstructive pulmonary disease, unspecified: Secondary | ICD-10-CM | POA: Diagnosis not present

## 2022-10-22 DIAGNOSIS — R7303 Prediabetes: Secondary | ICD-10-CM

## 2022-10-22 DIAGNOSIS — J42 Unspecified chronic bronchitis: Secondary | ICD-10-CM | POA: Diagnosis not present

## 2022-10-22 DIAGNOSIS — M5416 Radiculopathy, lumbar region: Secondary | ICD-10-CM | POA: Diagnosis not present

## 2022-10-22 DIAGNOSIS — E782 Mixed hyperlipidemia: Secondary | ICD-10-CM | POA: Diagnosis not present

## 2022-10-22 DIAGNOSIS — I10 Essential (primary) hypertension: Secondary | ICD-10-CM | POA: Diagnosis not present

## 2022-10-22 DIAGNOSIS — G4700 Insomnia, unspecified: Secondary | ICD-10-CM

## 2022-10-22 DIAGNOSIS — Z720 Tobacco use: Secondary | ICD-10-CM

## 2022-10-22 DIAGNOSIS — I739 Peripheral vascular disease, unspecified: Secondary | ICD-10-CM

## 2022-10-22 DIAGNOSIS — E559 Vitamin D deficiency, unspecified: Secondary | ICD-10-CM

## 2022-10-22 DIAGNOSIS — Z125 Encounter for screening for malignant neoplasm of prostate: Secondary | ICD-10-CM

## 2022-10-22 MED ORDER — NICOTINE 21 MG/24HR TD PT24: 21.0000 mg | MEDICATED_PATCH | Freq: Every day | TRANSDERMAL | 2 refills | Status: DC

## 2022-10-22 MED ORDER — TELMISARTAN 20 MG PO TABS: ORAL_TABLET | ORAL | 1 refills | Status: DC

## 2022-10-22 MED ORDER — TRAZODONE HCL 100 MG PO TABS: ORAL_TABLET | ORAL | 1 refills | Status: DC

## 2022-10-22 MED ORDER — ROSUVASTATIN CALCIUM 5 MG PO TABS: 5.0000 mg | ORAL_TABLET | Freq: Every day | ORAL | 1 refills | Status: DC

## 2022-10-22 MED ORDER — FLUTICASONE FUROATE-VILANTEROL 100-25 MCG/ACT IN AEPB: 1.0000 | INHALATION_SPRAY | Freq: Every day | RESPIRATORY_TRACT | 11 refills | Status: DC

## 2022-10-22 NOTE — Assessment & Plan Note (Signed)
Well-controlled with Breo and as needed albuterol

## 2022-10-22 NOTE — Assessment & Plan Note (Signed)
On Crestor

## 2022-10-22 NOTE — Assessment & Plan Note (Signed)
Ordered PSA after discussing its limitations for prostate cancer screening, including false positive results leading to additional investigations. 

## 2022-10-22 NOTE — Assessment & Plan Note (Addendum)
Smokes about 1 pack/day now  Asked about quitting: confirms that he currently smokes cigarettes Advise to quit smoking: Educated about QUITTING to reduce the risk of cancer, cardio and cerebrovascular disease. Assess willingness: Unwilling to quit at this time, but is working on cutting back. Assist with counseling and pharmacotherapy: Counseled for 5 minutes and literature provided.  Nicotine patch prescribed. Arrange for follow up: Follow up in 3 months and continue to offer help.

## 2022-10-22 NOTE — Patient Instructions (Addendum)
Please apply Voltaren gel for hand joints pain.  Please use Nicotine patch for smoking cessation.  Please continue taking other medications as prescribed.  Please continue to follow low salt diet and ambulate as tolerated.  Please get fasting blood tests done before the next visit.

## 2022-10-22 NOTE — Assessment & Plan Note (Addendum)
S/p L4-L5 microdiscectomy (11/23) Followed by neurosurgery Not on any pain medicine currently
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
## 2022-10-22 NOTE — Assessment & Plan Note (Signed)
S/p b/l femoral artery bypass graft On Crestor 10 mg QD On Aspirin

## 2022-10-22 NOTE — Assessment & Plan Note (Signed)
Well-controlled Continue trazodone for now

## 2022-10-22 NOTE — Assessment & Plan Note (Signed)
BP Readings from Last 1 Encounters:  10/22/22 129/74   Well-controlled Continue Telmisartan 20 mg QD Counseled for compliance with the medications Advised DASH diet and moderate exercise/walking, at least 150 mins/week

## 2022-10-22 NOTE — Assessment & Plan Note (Signed)
Lab Results  Component Value Date   HGBA1C 5.4 12/24/2021   Advised follow DASH diet for now

## 2022-11-27 ENCOUNTER — Ambulatory Visit (HOSPITAL_COMMUNITY): Admission: RE | Admit: 2022-11-27 | Payer: 59 | Source: Ambulatory Visit

## 2023-01-22 NOTE — Progress Notes (Signed)
Patient did not complete LDCT as scheduled, reminder letter mailed to patient's listed mailing address asking that the patient call me to schedule follow-up.  

## 2023-02-25 ENCOUNTER — Encounter: Payer: 59 | Admitting: Internal Medicine

## 2023-03-12 DIAGNOSIS — M50321 Other cervical disc degeneration at C4-C5 level: Secondary | ICD-10-CM | POA: Diagnosis not present

## 2023-03-12 DIAGNOSIS — M47812 Spondylosis without myelopathy or radiculopathy, cervical region: Secondary | ICD-10-CM | POA: Diagnosis not present

## 2023-03-12 DIAGNOSIS — G894 Chronic pain syndrome: Secondary | ICD-10-CM | POA: Diagnosis not present

## 2023-03-17 ENCOUNTER — Encounter: Payer: Self-pay | Admitting: Internal Medicine

## 2023-03-17 ENCOUNTER — Ambulatory Visit (INDEPENDENT_AMBULATORY_CARE_PROVIDER_SITE_OTHER): Payer: 59 | Admitting: Internal Medicine

## 2023-03-17 VITALS — BP 124/65 | HR 66 | Ht 73.0 in | Wt 168.8 lb

## 2023-03-17 DIAGNOSIS — Z125 Encounter for screening for malignant neoplasm of prostate: Secondary | ICD-10-CM

## 2023-03-17 DIAGNOSIS — L720 Epidermal cyst: Secondary | ICD-10-CM

## 2023-03-17 DIAGNOSIS — R7303 Prediabetes: Secondary | ICD-10-CM

## 2023-03-17 DIAGNOSIS — Z0001 Encounter for general adult medical examination with abnormal findings: Secondary | ICD-10-CM

## 2023-03-17 DIAGNOSIS — G4700 Insomnia, unspecified: Secondary | ICD-10-CM

## 2023-03-17 DIAGNOSIS — K130 Diseases of lips: Secondary | ICD-10-CM

## 2023-03-17 DIAGNOSIS — I10 Essential (primary) hypertension: Secondary | ICD-10-CM

## 2023-03-17 DIAGNOSIS — Z122 Encounter for screening for malignant neoplasm of respiratory organs: Secondary | ICD-10-CM

## 2023-03-17 DIAGNOSIS — Z23 Encounter for immunization: Secondary | ICD-10-CM

## 2023-03-17 DIAGNOSIS — Z720 Tobacco use: Secondary | ICD-10-CM

## 2023-03-17 DIAGNOSIS — F17210 Nicotine dependence, cigarettes, uncomplicated: Secondary | ICD-10-CM | POA: Diagnosis not present

## 2023-03-17 DIAGNOSIS — E782 Mixed hyperlipidemia: Secondary | ICD-10-CM | POA: Diagnosis not present

## 2023-03-17 DIAGNOSIS — I739 Peripheral vascular disease, unspecified: Secondary | ICD-10-CM

## 2023-03-17 DIAGNOSIS — E559 Vitamin D deficiency, unspecified: Secondary | ICD-10-CM

## 2023-03-17 DIAGNOSIS — J42 Unspecified chronic bronchitis: Secondary | ICD-10-CM

## 2023-03-17 MED ORDER — NICOTINE 21 MG/24HR TD PT24: 21.0000 mg | MEDICATED_PATCH | Freq: Every day | TRANSDERMAL | 2 refills | Status: DC

## 2023-03-17 MED ORDER — TELMISARTAN 20 MG PO TABS: ORAL_TABLET | ORAL | 1 refills | Status: DC

## 2023-03-17 MED ORDER — ROSUVASTATIN CALCIUM 5 MG PO TABS: 5.0000 mg | ORAL_TABLET | Freq: Every day | ORAL | 1 refills | Status: DC

## 2023-03-17 MED ORDER — MUPIROCIN 2 % EX OINT: 1.0000 | TOPICAL_OINTMENT | Freq: Two times a day (BID) | CUTANEOUS | 1 refills | Status: DC

## 2023-03-17 MED ORDER — TRAZODONE HCL 100 MG PO TABS: ORAL_TABLET | ORAL | 1 refills | Status: DC

## 2023-03-17 NOTE — Assessment & Plan Note (Signed)
Smokes about 1.5 pack/day now  Asked about quitting: confirms that he currently smokes cigarettes Advise to quit smoking: Educated about QUITTING to reduce the risk of cancer, cardio and cerebrovascular disease. Assess willingness: Unwilling to quit at this time, but is working on cutting back. Assist with counseling and pharmacotherapy: Counseled for 5 minutes and literature provided.  Nicotine patch prescribed. Arrange for follow up: Follow up in 3 months and continue to offer help.

## 2023-03-17 NOTE — Assessment & Plan Note (Addendum)
Physical exam as documented. Fasting blood test ordered today. PCV 20 vaccine today. Advised to get Shingrix vaccine at local pharmacy.
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
## 2023-03-17 NOTE — Assessment & Plan Note (Signed)
BP Readings from Last 1 Encounters:  03/17/23 124/65   Well-controlled Continue Telmisartan 20 mg QD Counseled for compliance with the medications Advised DASH diet and moderate exercise/walking, at least 150 mins/week

## 2023-03-17 NOTE — Assessment & Plan Note (Signed)
S/p b/l femoral artery bypass graft ?On Crestor 10 mg QD ?On Aspirin ?

## 2023-03-17 NOTE — Patient Instructions (Signed)
Please apply Mupirocin cream around lip area.  Please continue to take medications as prescribed.  Please continue to follow low salt diet and perform moderate exercise/walking at least 150 mins/week.  Please consider getting Shingrix vaccine at local pharmacy.

## 2023-03-17 NOTE — Assessment & Plan Note (Signed)
Has recurrent lip fissures, likely due to angular cheilitis Had good response with mupirocin ointment, refilled

## 2023-03-17 NOTE — Assessment & Plan Note (Signed)
His neck mass is likely epidermoid cyst vs lipoma (although less likely) Reassured it being benign If change in size or shape, will get [place] of soft tissue

## 2023-03-17 NOTE — Assessment & Plan Note (Signed)
Lab Results  Component Value Date   HGBA1C 5.4 12/24/2021   Advised follow DASH diet for now 

## 2023-03-17 NOTE — Assessment & Plan Note (Signed)
Well-controlled Continue trazodone for now 

## 2023-03-17 NOTE — Assessment & Plan Note (Signed)
Ordered PSA after discussing its limitations for prostate cancer screening, including false positive results leading to additional investigations. 

## 2023-03-17 NOTE — Assessment & Plan Note (Signed)
Well-controlled with Breo and as needed albuterol ? ?

## 2023-03-17 NOTE — Assessment & Plan Note (Signed)
On Crestor 

## 2023-03-18 LAB — CBC WITH DIFFERENTIAL/PLATELET
Basophils Absolute: 0.1 10*3/uL (ref 0.0–0.2)
Basos: 1 %
EOS (ABSOLUTE): 0.1 10*3/uL (ref 0.0–0.4)
Eos: 1 %
Hematocrit: 45.9 % (ref 37.5–51.0)
Hemoglobin: 15.7 g/dL (ref 13.0–17.7)
Immature Grans (Abs): 0 10*3/uL (ref 0.0–0.1)
Immature Granulocytes: 0 %
Lymphocytes Absolute: 2.6 10*3/uL (ref 0.7–3.1)
Lymphs: 22 %
MCH: 30.2 pg (ref 26.6–33.0)
MCHC: 34.2 g/dL (ref 31.5–35.7)
MCV: 88 fL (ref 79–97)
Monocytes Absolute: 0.7 10*3/uL (ref 0.1–0.9)
Monocytes: 6 %
Neutrophils Absolute: 8.1 10*3/uL — ABNORMAL HIGH (ref 1.4–7.0)
Neutrophils: 70 %
Platelets: 262 10*3/uL (ref 150–450)
RBC: 5.2 x10E6/uL (ref 4.14–5.80)
RDW: 12.7 % (ref 11.6–15.4)
WBC: 11.7 10*3/uL — ABNORMAL HIGH (ref 3.4–10.8)

## 2023-03-18 LAB — CMP14+EGFR
ALT: 17 IU/L (ref 0–44)
AST: 16 IU/L (ref 0–40)
Albumin/Globulin Ratio: 1.5
Albumin: 4.5 g/dL (ref 3.9–4.9)
Alkaline Phosphatase: 49 IU/L (ref 44–121)
BUN/Creatinine Ratio: 15 (ref 10–24)
BUN: 15 mg/dL (ref 8–27)
Bilirubin Total: 0.3 mg/dL (ref 0.0–1.2)
CO2: 24 mmol/L (ref 20–29)
Calcium: 9.9 mg/dL (ref 8.6–10.2)
Chloride: 100 mmol/L (ref 96–106)
Creatinine, Ser: 1.01 mg/dL (ref 0.76–1.27)
Globulin, Total: 3 g/dL (ref 1.5–4.5)
Glucose: 88 mg/dL (ref 70–99)
Potassium: 4.3 mmol/L (ref 3.5–5.2)
Sodium: 137 mmol/L (ref 134–144)
Total Protein: 7.5 g/dL (ref 6.0–8.5)
eGFR: 83 mL/min/{1.73_m2} (ref >59)

## 2023-03-18 LAB — LIPID PANEL
Chol/HDL Ratio: 3.8 ratio (ref 0.0–5.0)
Cholesterol, Total: 176 mg/dL (ref 100–199)
HDL: 46 mg/dL (ref >39)
LDL Chol Calc (NIH): 87 mg/dL (ref 0–99)
Triglycerides: 259 mg/dL — ABNORMAL HIGH (ref 0–149)
VLDL Cholesterol Cal: 43 mg/dL — ABNORMAL HIGH (ref 5–40)

## 2023-03-18 LAB — HEMOGLOBIN A1C
Est. average glucose Bld gHb Est-mCnc: 114 mg/dL
Hgb A1c MFr Bld: 5.6 % (ref 4.8–5.6)

## 2023-03-18 LAB — VITAMIN D 25 HYDROXY (VIT D DEFICIENCY, FRACTURES): Vit D, 25-Hydroxy: 46.2 ng/mL (ref 30.0–100.0)

## 2023-03-18 LAB — TSH: TSH: 1.11 u[IU]/mL (ref 0.450–4.500)

## 2023-03-18 LAB — PSA: Prostate Specific Ag, Serum: 0.5 ng/mL (ref 0.0–4.0)

## 2023-04-24 NOTE — Progress Notes (Signed)
Patient CT scan rescheduled to 8/19

## 2023-05-12 ENCOUNTER — Telehealth: Payer: Self-pay

## 2023-05-12 ENCOUNTER — Other Ambulatory Visit: Payer: Self-pay

## 2023-05-12 ENCOUNTER — Other Ambulatory Visit: Payer: Self-pay | Admitting: Internal Medicine

## 2023-05-12 ENCOUNTER — Telehealth: Payer: Self-pay | Admitting: Internal Medicine

## 2023-05-12 DIAGNOSIS — G4700 Insomnia, unspecified: Secondary | ICD-10-CM

## 2023-05-12 MED ORDER — TRAZODONE HCL 150 MG PO TABS: 150.0000 mg | ORAL_TABLET | Freq: Every day | ORAL | 3 refills | Status: DC

## 2023-05-12 MED ORDER — TRAZODONE HCL 100 MG PO TABS
ORAL_TABLET | ORAL | 1 refills | Status: DC
Start: 2023-05-12 — End: 2023-05-12

## 2023-05-12 NOTE — Telephone Encounter (Signed)
Refills sent to pharmacy. 

## 2023-05-12 NOTE — Telephone Encounter (Signed)
Patient calling needing a refill on traZODone (DESYREL) 100 MG tablet [[id number]] . [employer]

## 2023-05-12 NOTE — Telephone Encounter (Signed)
Patient advised.

## 2023-05-26 ENCOUNTER — Ambulatory Visit (HOSPITAL_COMMUNITY): Admission: RE | Admit: 2023-05-26 | Payer: 59 | Source: Ambulatory Visit

## 2023-09-03 ENCOUNTER — Other Ambulatory Visit: Payer: Self-pay | Admitting: Internal Medicine

## 2023-09-03 DIAGNOSIS — G4700 Insomnia, unspecified: Secondary | ICD-10-CM

## 2023-09-10 ENCOUNTER — Ambulatory Visit (INDEPENDENT_AMBULATORY_CARE_PROVIDER_SITE_OTHER): Payer: 59

## 2023-09-10 VITALS — Ht 73.0 in | Wt 178.0 lb

## 2023-09-10 DIAGNOSIS — Z0000 Encounter for general adult medical examination without abnormal findings: Secondary | ICD-10-CM | POA: Diagnosis not present

## 2023-09-10 DIAGNOSIS — Z1211 Encounter for screening for malignant neoplasm of colon: Secondary | ICD-10-CM
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
## 2023-09-10 NOTE — Patient Instructions (Signed)
[name] , Thank you for taking time to come for your Medicare Wellness Visit. I appreciate your ongoing commitment to your health goals. Please review the following plan we discussed and let me know if I can assist you in the future.   Referrals/Orders/Follow-Ups/Clinician Recommendations:  Next Medicare Annual Wellness Visit: September 13, 2024 at 1:10 pm virtual appointment  You have been referred to [facility].If you haven't heard from them within the next week, please call them to schedule your appointment.    Address: [address], [place], [place] [address] Phone: [phone]   This is a list of the screening recommended for you and due dates:  Health Maintenance  Topic Date Due   Zoster (Shingles) Vaccine (1 of 2) Never done   COVID-19 Vaccine (3 - Moderna risk series) 05/08/2020   Screening for Lung Cancer  10/23/2022   Flu Shot  05/08/2023   Medicare Annual Wellness Visit  09/09/2024   Colon Cancer Screening  04/16/2025   DTaP/Tdap/Td vaccine (2 - Td or Tdap) 06/12/2028   Pneumonia Vaccine  Completed   Hepatitis C Screening  Completed   HIV Screening  Completed   HPV Vaccine  Aged Out    Advanced directives: (ACP Link)Information on Advanced Care Planning can be found at [facility] Advance Health Care Directives Advance Health Care Directives ([email or web address]   Next Medicare Annual Wellness Visit scheduled for next year: Yes  Preventive Care 65 Years and Older, Male Preventive care refers to lifestyle choices and visits with your health care provider that can promote health and wellness. Preventive care visits are also called wellness exams. What can I expect for my preventive care visit? Counseling During your preventive care visit, your health care provider may ask about your: Medical history, including: Past medical problems. Family medical history. History of falls. Current health, including: Emotional well-being. Home life and  relationship well-being. Sexual activity. Memory and ability to understand (cognition). Lifestyle, including: Alcohol, nicotine or tobacco, and drug use. Access to firearms. Diet, exercise, and sleep habits. Work and work Astronomer. Sunscreen use. Safety issues such as seatbelt and bike helmet use. Physical exam Your health care provider will check your: Height and weight. These may be used to calculate your BMI (body mass index). BMI is a measurement that tells if you are at a healthy weight. Waist circumference. This measures the distance around your waistline. This measurement also tells if you are at a healthy weight and may help predict your risk of certain diseases, such as type 2 diabetes and high blood pressure. Heart rate and blood pressure. Body temperature. Skin for abnormal spots. What immunizations do I need?  Vaccines are usually given at various ages, according to a schedule. Your health care provider will recommend vaccines for you based on your age, medical history, and lifestyle or other factors, such as travel or where you work. What tests do I need? Screening Your health care provider may recommend screening tests for certain conditions. This may include: Lipid and cholesterol levels. Diabetes screening. This is done by checking your blood sugar (glucose) after you have not eaten for a while (fasting). Hepatitis C test. Hepatitis B test. HIV (human immunodeficiency virus) test. STI (sexually transmitted infection) testing, if you are at risk. Lung cancer screening. Colorectal cancer screening. Prostate cancer screening. Abdominal aortic aneurysm (AAA) screening. You may need this if you are a current or former smoker. Talk with your health care provider about your test results, treatment options, and  if necessary, the need for more tests. Follow these instructions at home: Eating and drinking  Eat a diet that includes fresh fruits and vegetables, whole  grains, lean protein, and low-fat dairy products. Limit your intake of foods with high amounts of sugar, saturated fats, and salt. Take vitamin and mineral supplements as recommended by your health care provider. Do not drink alcohol if your health care provider tells you not to drink. If you drink alcohol: Limit how much you have to 0-2 drinks a day. Know how much alcohol is in your drink. In the [place] one drink equals one 12 oz bottle of beer (355 mL), one 5 oz glass of wine (148 mL), or one 1 oz glass of hard liquor (44 mL). Lifestyle Brush your teeth every morning and night with fluoride toothpaste. Floss one time each day. Exercise for at least 30 minutes 5 or more days each week. Do not use any products that contain nicotine or tobacco. These products include cigarettes, chewing tobacco, and vaping devices, such as e-cigarettes. If you need help quitting, ask your health care provider. Do not use drugs. If you are sexually active, practice safe sex. Use a condom or other form of protection to prevent STIs. Take aspirin only as told by your health care provider. Make sure that you understand how much to take and what form to take. Work with your health care provider to find out whether it is safe and beneficial for you to take aspirin daily. Ask your health care provider if you need to take a cholesterol-lowering medicine (statin). Find healthy ways to manage stress, such as: Meditation, yoga, or listening to music. Journaling. Talking to a trusted person. Spending time with friends and family. Safety Always wear your seat belt while driving or riding in a vehicle. Do not drive: If you have been drinking alcohol. Do not ride with someone who has been drinking. When you are tired or distracted. While texting. If you have been using any mind-altering substances or drugs. Wear a helmet and other protective equipment during sports activities. If you have firearms in your house, make sure  you follow all gun safety procedures. Minimize exposure to UV radiation to reduce your risk of skin cancer. What's next? Visit your health care provider once a year for an annual wellness visit. Ask your health care provider how often you should have your eyes and teeth checked. Stay up to date on all vaccines. This information is not intended to replace advice given to you by your health care provider. Make sure you discuss any questions you have with your health care provider. Document Revised: 03/21/2021 Document Reviewed: 03/21/2021 Elsevier Patient Education  2024 [employer]. Lung Cancer Screening A lung cancer screening is a test that checks for lung cancer when there are no symptoms or history of that disease. The screening is done to look for lung cancer in its very early stages. Finding cancer early improves the chances of successful treatment. It may save your life. Who should have a screening? You should be screened for lung cancer if all of these apply: You currently smoke or you used to smoke. You are between the ages of 76 and 54 years old. Screening may be recommended up to age 66 depending on your overall health and other factors. You have a smoking history of 1 pack of cigarettes a day for 20 years or 2 packs a day for 10 years. How is screening done?  The recommended screening test is  a low-dose computed tomography (LDCT) scan. This scan takes detailed images of the lungs. This allows a health care provider to look for abnormal cells. If you are at risk for lung cancer, it is recommended that you get screened once a year. Talk to your health care provider about the risks, benefits, and limitations of screening. What are the benefits of screening? Screening can find lung cancer early, before symptoms start and before it has spread outside of the lungs. The chances of curing lung cancer are greater if the cancer is diagnosed early. What are the risks of screening? The  screening may show lung cancer when no cancer is present. Talk with your health care provider about what your results mean. In some cases, your health care provider may do more testing to confirm the results. The screening may not find lung cancer when it is present. You will be exposed to radiation from repeated LDCT tests, which can cause cancer in otherwise healthy people. How can I lower my risk of lung cancer? Make these lifestyle changes to lower your risk of developing lung cancer: Do not use any products that contain nicotine or tobacco. These products include cigarettes, chewing tobacco, and vaping devices, such as e-cigarettes. If you need help quitting, ask your health care provider. Avoid secondhand smoke. Avoid exposure to radiation. Avoid exposure to radon gas. Have your home checked for radon regularly. Avoid things that cause cancer (carcinogens). Avoid living or working in places with high air pollution or diesel exhaust. Questions to ask your health care provider Am I eligible for lung cancer screening? Does my health insurance cover the cost of lung cancer screening? What happens if the lung cancer screening shows something of concern? How soon will I have results from my lung cancer screening? Is there anything that I need to do to prepare for my lung cancer screening? What happens if I decide not to have lung cancer screening? Where to find more information Ask your health care provider about the risks and benefits of screening. More information and resources are available from these organizations: American Cancer Society (ACS): cancer.org American Lung Association: lung.org National Cancer Institute: cancer.gov Contact a health care provider if: You start to show symptoms of lung cancer, including: A cough that will not go away. High-pitched whistling sounds when you breathe, most often when you breathe out (wheezing). Chest pain. Coughing up blood. Shortness of  breath. Weight loss that cannot be explained. Constant tiredness (fatigue). Hoarse voice. Summary Lung cancer screening may find lung cancer before symptoms appear. Finding cancer early improves the chances of successful treatment. It may save your life. The recommended screening test is a low-dose computed tomography (LDCT) scan that looks for abnormal cells in the lungs. If you are at risk for lung cancer, it is recommended that you get screened once a year. You can make lifestyle changes to lower your risk of lung cancer. Ask your health care provider about the risks and benefits of screening. This information is not intended to replace advice given to you by your health care provider. Make sure you discuss any questions you have with your health care provider. Document Revised: 10/01/2022 Document Reviewed: 03/14/2021 Elsevier Patient Education  2024 [employer]. Understanding Your Risk for Falls Millions of people have serious injuries from falls each year. It is important to understand your risk of falling. Talk with your health care provider about your risk and what you can do to lower it. If you do have a  serious fall, make sure to tell your provider. Falling once raises your risk of falling again. How can falls affect me? Serious injuries from falls are common. These include: Broken bones, such as hip fractures. Head injuries, such as traumatic brain injuries (TBI) or concussions. A fear of falling can cause you to avoid activities and stay at home. This can make your muscles weaker and raise your risk for a fall. What can increase my risk? There are a number of risk factors that increase your risk for falling. The more risk factors you have, the higher your risk of falling. Serious injuries from a fall happen most often to people who are older than 65 years old. Teenagers and young adults ages 58-29 are also at higher risk. Common risk factors include: Weakness in the lower  body. Being generally weak or confused due to long-term (chronic) illness. Dizziness or balance problems. Poor vision. Medicines that cause dizziness or drowsiness. These may include: Medicines for your blood pressure, heart, anxiety, insomnia, or swelling (edema). Pain medicines. Muscle relaxants. Other risk factors include: Drinking alcohol. Having had a fall in the past. Having foot pain or wearing improper footwear. Working at a dangerous job. Having any of the following in your home: Tripping hazards, such as floor clutter or loose rugs. Poor lighting. Pets. Having dementia or memory loss. What actions can I take to lower my risk of falling?     Physical activity Stay physically fit. Do strength and balance exercises. Consider taking a regular class to build strength and balance. Yoga and tai chi are good options. Vision Have your eyes checked every year and your prescription for glasses or contacts updated as needed. Shoes and walking aids Wear non-skid shoes. Wear shoes that have rubber soles and low heels. Do not wear high heels. Do not walk around the house in socks or slippers. Use a cane or walker as told by your provider. Home safety Attach secure railings on both sides of your stairs. Install grab bars for your bathtub, shower, and toilet. Use a non-skid mat in your bathtub or shower. Attach bath mats securely with double-sided, non-slip rug tape. Use good lighting in all rooms. Keep a flashlight near your bed. Make sure there is a clear path from your bed to the bathroom. Use night-lights. Do not use throw rugs. Make sure all carpeting is taped or tacked down securely. Remove all clutter from walkways and stairways, including extension cords. Repair uneven or broken steps and floors. Avoid walking on icy or slippery surfaces. Walk on the grass instead of on icy or slick sidewalks. Use ice melter to get rid of ice on walkways in the winter. Use a cordless  phone. Questions to ask your health care provider Can you help me check my risk for a fall? Do any of my medicines make me more likely to fall? Should I take a vitamin D supplement? What exercises can I do to improve my strength and balance? Should I make an appointment to have my vision checked? Do I need a bone density test to check for weak bones (osteoporosis)? Would it help to use a cane or a walker? Where to find more information Centers for Disease Control and Prevention, STEADI: TonerPromos.no Community-Based Fall Prevention Programs: TonerPromos.no [employer] on Aging: BaseRingTones.pl Contact a health care provider if: You fall at home. You are afraid of falling at home. You feel weak, drowsy, or dizzy. This information is not intended to replace advice given to you by  your health care provider. Make sure you discuss any questions you have with your health care provider. Document Revised: 05/27/2022 Document Reviewed: 05/27/2022 Elsevier Patient Education  2024 [employer].

## 2023-09-11 ENCOUNTER — Encounter: Payer: Self-pay | Admitting: *Deleted

## 2023-09-15 ENCOUNTER — Ambulatory Visit: Payer: 59 | Admitting: Internal Medicine

## 2023-10-12 ENCOUNTER — Other Ambulatory Visit: Payer: Self-pay | Admitting: Internal Medicine

## 2023-10-12 DIAGNOSIS — E782 Mixed hyperlipidemia: Secondary | ICD-10-CM

## 2023-10-30 ENCOUNTER — Encounter: Payer: Self-pay | Admitting: Internal Medicine

## 2023-10-30 ENCOUNTER — Ambulatory Visit (INDEPENDENT_AMBULATORY_CARE_PROVIDER_SITE_OTHER): Payer: 59 | Admitting: Internal Medicine

## 2023-10-30 VITALS — BP 136/69 | HR 63 | Ht 73.0 in | Wt 181.2 lb

## 2023-10-30 DIAGNOSIS — R7303 Prediabetes: Secondary | ICD-10-CM | POA: Diagnosis not present

## 2023-10-30 DIAGNOSIS — E782 Mixed hyperlipidemia: Secondary | ICD-10-CM | POA: Diagnosis not present

## 2023-10-30 DIAGNOSIS — E559 Vitamin D deficiency, unspecified: Secondary | ICD-10-CM | POA: Diagnosis not present

## 2023-10-30 DIAGNOSIS — Z125 Encounter for screening for malignant neoplasm of prostate: Secondary | ICD-10-CM

## 2023-10-30 DIAGNOSIS — Z720 Tobacco use: Secondary | ICD-10-CM

## 2023-10-30 DIAGNOSIS — M5030 Other cervical disc degeneration, unspecified cervical region: Secondary | ICD-10-CM | POA: Insufficient documentation

## 2023-10-30 DIAGNOSIS — I739 Peripheral vascular disease, unspecified: Secondary | ICD-10-CM | POA: Diagnosis not present

## 2023-10-30 DIAGNOSIS — M5416 Radiculopathy, lumbar region: Secondary | ICD-10-CM | POA: Diagnosis not present

## 2023-10-30 DIAGNOSIS — J42 Unspecified chronic bronchitis: Secondary | ICD-10-CM | POA: Diagnosis not present

## 2023-10-30 DIAGNOSIS — G4700 Insomnia, unspecified: Secondary | ICD-10-CM | POA: Diagnosis not present

## 2023-10-30 DIAGNOSIS — I10 Essential (primary) hypertension: Secondary | ICD-10-CM

## 2023-10-30 MED ORDER — BREZTRI AEROSPHERE 160-9-4.8 MCG/ACT IN AERO: 2.0000 | INHALATION_SPRAY | Freq: Two times a day (BID) | RESPIRATORY_TRACT | 11 refills | Status: DC

## 2023-10-30 MED ORDER — ALBUTEROL SULFATE HFA 108 (90 BASE) MCG/ACT IN AERS: 2.0000 | INHALATION_SPRAY | Freq: Four times a day (QID) | RESPIRATORY_TRACT | 5 refills | Status: DC | PRN

## 2023-10-30 MED ORDER — TELMISARTAN 20 MG PO TABS: ORAL_TABLET | ORAL | 1 refills | Status: DC

## 2023-10-30 NOTE — Assessment & Plan Note (Addendum)
Well-controlled Continue trazodone 150 mg nightly for now

## 2023-10-30 NOTE — Assessment & Plan Note (Signed)
Lab Results  Component Value Date   HGBA1C 5.6 03/17/2023   Advised follow DASH diet for now

## 2023-10-30 NOTE — Assessment & Plan Note (Addendum)
On Crestor 5 mg once daily Lipid profile reviewed
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
## 2023-10-30 NOTE — Assessment & Plan Note (Signed)
Chronic neck pain Followed by spine surgery Tylenol as needed for pain

## 2023-10-30 NOTE — Assessment & Plan Note (Signed)
Smokes about 1 pack/day now  Asked about quitting: confirms that he currently smokes cigarettes Advise to quit smoking: Educated about QUITTING to reduce the risk of cancer, cardio and cerebrovascular disease. Assess willingness: Unwilling to quit at this time, but is working on cutting back. Assist with counseling and pharmacotherapy: Counseled for 5 minutes and literature provided.  Nicotine patch prescribed. Arrange for follow up: Follow up in 3 months and continue to offer help.

## 2023-10-30 NOTE — Assessment & Plan Note (Addendum)
Uncontrolled due to noncompliance Started Breztri as maintenance inhaler Albuterol as needed for dyspnea or wheezing Discussed about difference of maintenance and rescue inhaler and appropriate use of them Needs to quit smoking

## 2023-10-30 NOTE — Assessment & Plan Note (Signed)
BP Readings from Last 1 Encounters:  10/30/23 136/69   Well-controlled Continue Telmisartan 20 mg QD Counseled for compliance with the medications Advised DASH diet and moderate exercise/walking, at least 150 mins/week

## 2023-10-30 NOTE — Patient Instructions (Addendum)
Please start using Breztri twice daily regularly. Rinse your mouth after each use. Please use Albuterol as needed for shortness of breath or wheezing.  Please continue to take other medications as prescribed.  Please continue to follow low salt diet and perform moderate exercise/walking at least 150 mins/week.  Please get fasting blood tests done before the next visit.

## 2023-10-30 NOTE — Assessment & Plan Note (Signed)
Ordered PSA after discussing its limitations for prostate cancer screening, including false positive results leading to additional investigations. 

## 2023-10-30 NOTE — Assessment & Plan Note (Addendum)
S/p b/l femoral artery bypass graft On Crestor 10 mg QD On Aspirin 81 mg QD Needs to quit smoking

## 2023-10-30 NOTE — Assessment & Plan Note (Signed)
S/p L4-L5 microdiscectomy (11/23) Followed by neurosurgery Not on any pain medicine currently

## 2023-11-13 ENCOUNTER — Ambulatory Visit (HOSPITAL_COMMUNITY): Payer: 59

## 2023-11-18 ENCOUNTER — Other Ambulatory Visit: Payer: Self-pay

## 2023-11-18 DIAGNOSIS — Z122 Encounter for screening for malignant neoplasm of respiratory organs: Secondary | ICD-10-CM

## 2023-11-18 DIAGNOSIS — Z87891 Personal history of nicotine dependence: Secondary | ICD-10-CM

## 2023-12-02 ENCOUNTER — Ambulatory Visit (HOSPITAL_COMMUNITY)
Admission: RE | Admit: 2023-12-02 | Discharge: 2023-12-02 | Disposition: A | Discharge status: Home / Self Care | Payer: 59 | Source: Ambulatory Visit | Attending: Oncology | Admitting: Oncology

## 2023-12-02 DIAGNOSIS — Z87891 Personal history of nicotine dependence: Secondary | ICD-10-CM | POA: Diagnosis not present

## 2023-12-02 DIAGNOSIS — F17210 Nicotine dependence, cigarettes, uncomplicated: Secondary | ICD-10-CM | POA: Diagnosis not present

## 2023-12-02 DIAGNOSIS — Z122 Encounter for screening for malignant neoplasm of respiratory organs: Secondary | ICD-10-CM | POA: Insufficient documentation

## 2023-12-10 ENCOUNTER — Ambulatory Visit: Payer: Self-pay | Admitting: Internal Medicine

## 2023-12-10 NOTE — Telephone Encounter (Addendum)
 Message from [place] L sent at 12/10/2023 11:09 AM EST  Summary: tick bite   Copied From CRM [id number]. Reason for Triage: Tick on right leg. Patient took tick off but believes the head might still be attached to leg. Patient states it is swollen, red, sore and hard to touch where bite is at.    Pt seeking clinical advice        Called pt  x 3 times due to wrong number and a message recording cannot reach the number you have dialed."but no VM,

## 2023-12-10 NOTE — Telephone Encounter (Signed)
 Reason for Disposition ?? Third attempt to contact caller AND no contact made. Phone number verified. ? ?Protocols used: No Contact or Duplicate Contact Call-A-AH ? ?

## 2023-12-10 NOTE — Telephone Encounter (Signed)
 Message from [place] L sent at 12/10/2023 11:09 AM EST  Summary: tick bite   Copied From CRM [id number]. Reason for Triage: Tick on right leg. Patient took tick off but believes the head might still be attached to leg. Patient states it is swollen, red, sore and hard to touch where bite is at.    Pt seeking clinical advice        "Your call cannot be completed as dialed" unable to VM.

## 2023-12-10 NOTE — Telephone Encounter (Signed)
 Chief Complaint: Tick bite Symptoms: Rash, itchiness, swelling Frequency: Constant Pertinent Negatives: Patient denies fever, headache, ring around bite, pus, drainage Disposition: [] ED /[x] Urgent Care (no appt availability in office) / [] Appointment(In office/virtual)/ []  [facility]/ [] Home Care/ [] Refused Recommended Disposition /[] [facility]/ []  Follow-up with PCP Additional Notes: Patient called with complaints of a tick bite he sustained two days ago. Patient states  the tick was flat and just slightly bigger than an apple seed, and patient was able to remove it but thinks the head is still inside his leg. Right leg is affected and the bite site is swollen with red rash that patient did not articulate as a ring, 8/10 tenderness with palpation, no medications taken. Patient states that the skin around the rash is hardening and there is some mild itchiness. Patient denies fever, headache, pus/drainage, or red streak. Patient advised by this RN to be seen within 24 hors and patient agreeable. Appt scheduled however this RN attempted to call patient back to escalate care to UC within 4 hours per protocol but was unable to reach patient, receiving this message, "call could not be completed as dialed. Will continue to attempt and keep appt as scheduled in the meantime.   Copied from CRM [id number]. Topic: Clinical - Pink Word Triage >> Dec 10, 2023 11:08 AM Albin [place] L wrote: Reason for Triage: Tick on right leg. Patient took tick off but believes the head might still be attached to leg. Patient states it is swollen, red, sore and hard to touch where bite is at.    Pt seeking clinical advice Reason for Disposition  Can't remove live tick (after trying Care Advice)  Answer Assessment - Initial Assessment Questions 1. ATTACHED:  "Is the tick still on the skin?"  (e.g., yes, no, unsure)     "I think its still in the leg 2. ONSET - TICK STILL ATTACHED:  "How long do you think the tick  has been on your skin?" (e.g., hours, days, unsure)  Note:  Is there a recent activity (camping, hiking) where the caller may have been exposed?      Two ago 3. ONSET - TICK NOT STILL ATTACHED: "If the tick has been removed, how long do you think the tick was attached before you removed it?" (e.g., 5 hours, 2 days). "When was this?"     Two 4. LOCATION: "Where is the tick bite located?" (e.g., arm, leg)     Right leg 5. TYPE of TICK: "Is it a wood tick or a deer tick?" (e.g., deer tick, wood tick; unsure)     Unsure 6. SIZE of TICK: "How big is the tick?" (e.g., size of poppy seed, apple seed, watermelon seed; unsure) Note: Deer ticks can be the size of a poppy seed (nymph) or an apple seed (adult).       Little bigger apple seed 7. ENGORGED: "Did the tick look flat or engorged (full, swollen)?" (e.g., flat, engorged; unsure)     Flat 8. OTHER SYMPTOMS: "Do you have any other symptoms?" (e.g., fever, rash, redness at bite area, red ring around bite)     Redness, skin hardness, little itchiness  Protocols used: Tick Bite-A-AH

## 2023-12-11 ENCOUNTER — Ambulatory Visit (INDEPENDENT_AMBULATORY_CARE_PROVIDER_SITE_OTHER): Admitting: Internal Medicine

## 2023-12-11 ENCOUNTER — Encounter: Payer: Self-pay | Admitting: Internal Medicine

## 2023-12-11 VITALS — BP 148/68 | HR 63 | Ht 73.0 in | Wt 182.8 lb

## 2023-12-11 DIAGNOSIS — W57XXXA Bitten or stung by nonvenomous insect and other nonvenomous arthropods, initial encounter: Secondary | ICD-10-CM

## 2023-12-11 DIAGNOSIS — S70361A Insect bite (nonvenomous), right thigh, initial encounter: Secondary | ICD-10-CM | POA: Diagnosis not present

## 2023-12-11 MED ORDER — DOXYCYCLINE HYCLATE 100 MG PO TABS
100.0000 mg | ORAL_TABLET | Freq: Two times a day (BID) | ORAL | 0 refills | Status: AC
Start: 2023-12-11 — End: 2023-12-21

## 2023-12-11 NOTE — Assessment & Plan Note (Signed)
 Presenting today for an acute visit for evaluation of a tick bite on the right medial thigh.  He was working in the woods with his son several days ago and removed a tick 3 days ago.  There is currently a 1.5 x 1.5 cm circumferential erythematous rash at the site where the bite occurred.  No systemic symptoms present.  Patient denies a history of RMSF and Lyme disease. -Given recent tick bite with overlying rash, will empirically treat with doxycycline 100 mg twice daily x 10 days.  He was instructed to return to care if he develops systemic symptoms or if the rash worsens.  Otherwise, he is scheduled for routine follow-up with Dr. Allena Katz in June.

## 2023-12-11 NOTE — Progress Notes (Signed)
   Acute Office Visit  Subjective:     Patient ID: Gary [name], male    DOB: [birth year], 68 y.o.   MRN: [id number]  Chief Complaint  Patient presents with   Tick Removal    Tick bite on right thigh , swollen red and itchy    [name] presents today for an acute visit for evaluation of a tick bite on his right thigh.  He removed a tick 3 days ago and had been working in the woods 2 days prior to noticing the tick.  Since removal, he has developed an itching, erythematous rash.  Denies systemic symptoms of fever/chills, myalgias, and fatigue.  He has been applying rubbing alcohol to the site of the bite.  Review of Systems  Skin:  Positive for itching and rash.       Tick bite on right medial thigh      Objective:    BP (!) 148/68 (BP Location: Left Arm, Patient Position: Sitting, Cuff Size: Normal)   Pulse 63   Ht 6\' 1"  (1.854 m)   Wt 182 lb 12.8 oz (82.9 kg)   SpO2 96%   BMI 24.12 kg/m   Physical Exam Skin:    General: Skin is warm and dry.     Findings: Lesion (There is a 1.5 x 1.5 cm circumferential erythematous lesion on the right medial thigh) present.       Assessment & Plan:   Problem List Items Addressed This Visit       Tick bite of right thigh - Primary   Presenting today for an acute visit for evaluation of a tick bite on the right medial thigh.  He was working in the woods with his son several days ago and removed a tick 3 days ago.  There is currently a 1.5 x 1.5 cm circumferential erythematous rash at the site where the bite occurred.  No systemic symptoms present.  Patient denies a history of RMSF and Lyme disease. -Given recent tick bite with overlying rash, will empirically treat with doxycycline 100 mg twice daily x 10 days.  He was instructed to return to care if he develops systemic symptoms or if the rash worsens.  Otherwise, he is scheduled for routine follow-up with Dr. Allena Katz in June.       Meds ordered this encounter  Medications    doxycycline (VIBRA-TABS) 100 MG tablet    Sig: Take 1 tablet (100 mg total) by mouth 2 (two) times daily for 10 days.    Dispense:  20 tablet    Refill:  0    Return if symptoms worsen or fail to improve.  [name], MD

## 2023-12-11 NOTE — Patient Instructions (Signed)
 It was a pleasure to see you today.  Thank you for giving [place] the opportunity to be involved in your care.  Below is a brief recap of your visit and next steps.  We will plan to see you again in June.  Summary Doxycycline 100 mg twice daily x 10 days prescribed in the setting of tick bite with rash. Follow up if symptoms worsening / not improving. Otherwise you are scheduled for follow up with Dr. Allena Katz in June.

## 2023-12-24 NOTE — Progress Notes (Signed)
 Patient notified of LDCT Lung Cancer Screening Results via mail with the recommendation to follow-up in 12 months. Patient's referring provider has been sent a copy of results. Results are as follows:  IMPRESSION: 1. Lung-RADS 2, benign appearance or behavior. Continue annual screening with low-dose chest CT without contrast in 12 months. 2. Left-sided thyroid nodule of 1.6 cm. Recommend thyroid US (ref: J Am Coll Radiol. 2015 Feb;12(2): 143-50). 3. Aortic atherosclerosis (ICD10-I70.0), coronary artery atherosclerosis and emphysema (ICD10-J43.9).

## 2024-02-29 ENCOUNTER — Other Ambulatory Visit: Payer: Self-pay | Admitting: Internal Medicine

## 2024-02-29 DIAGNOSIS — G4700 Insomnia, unspecified: Secondary | ICD-10-CM

## 2024-03-11 ENCOUNTER — Encounter (INDEPENDENT_AMBULATORY_CARE_PROVIDER_SITE_OTHER): Payer: Self-pay | Admitting: *Deleted

## 2024-03-15 ENCOUNTER — Other Ambulatory Visit: Payer: Self-pay | Admitting: Internal Medicine

## 2024-03-15 DIAGNOSIS — I10 Essential (primary) hypertension: Secondary | ICD-10-CM

## 2024-03-15 DIAGNOSIS — E782 Mixed hyperlipidemia: Secondary | ICD-10-CM

## 2024-03-29 ENCOUNTER — Ambulatory Visit (INDEPENDENT_AMBULATORY_CARE_PROVIDER_SITE_OTHER): Payer: 59 | Admitting: Internal Medicine

## 2024-03-29 ENCOUNTER — Encounter: Payer: Self-pay | Admitting: Internal Medicine

## 2024-03-29 VITALS — BP 135/60 | HR 63 | Ht 73.0 in | Wt 176.4 lb

## 2024-03-29 DIAGNOSIS — Z0001 Encounter for general adult medical examination with abnormal findings: Secondary | ICD-10-CM

## 2024-03-29 DIAGNOSIS — I739 Peripheral vascular disease, unspecified: Secondary | ICD-10-CM

## 2024-03-29 DIAGNOSIS — J42 Unspecified chronic bronchitis: Secondary | ICD-10-CM | POA: Diagnosis not present

## 2024-03-29 DIAGNOSIS — E782 Mixed hyperlipidemia: Secondary | ICD-10-CM

## 2024-03-29 DIAGNOSIS — I10 Essential (primary) hypertension: Secondary | ICD-10-CM | POA: Diagnosis not present

## 2024-03-29 DIAGNOSIS — E041 Nontoxic single thyroid nodule: Secondary | ICD-10-CM | POA: Insufficient documentation

## 2024-03-29 DIAGNOSIS — G4700 Insomnia, unspecified: Secondary | ICD-10-CM

## 2024-03-29 NOTE — Assessment & Plan Note (Signed)
 CT of lung showed incidental left sided thyroid  nodule - 1.6 cm in size Check US  thyroid 
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
## 2024-03-29 NOTE — Assessment & Plan Note (Addendum)
 On Crestor 5 mg once daily Check lipid profile

## 2024-03-29 NOTE — Assessment & Plan Note (Signed)
 S/p b/l femoral artery bypass graft On Crestor 10 mg QD On Aspirin 81 mg QD Needs to quit smoking

## 2024-03-29 NOTE — Assessment & Plan Note (Signed)
 BP Readings from Last 1 Encounters:  03/29/24 135/60   Well-controlled Continue Telmisartan  20 mg QD Counseled for compliance with the medications Advised DASH diet and moderate exercise/walking, at least 150 mins/week

## 2024-03-29 NOTE — Assessment & Plan Note (Signed)
 Uncontrolled due to noncompliance Has Breztri  as maintenance inhaler Albuterol  as needed for dyspnea or wheezing Discussed about difference of maintenance and rescue inhaler and appropriate use of them Needs to quit smoking

## 2024-03-29 NOTE — Assessment & Plan Note (Signed)
Physical exam as documented. Fasting blood tests ordered today. Advised to get Shingrix vaccine at local pharmacy.

## 2024-03-29 NOTE — Patient Instructions (Signed)
 Please continue to take medications as prescribed.  Please continue to follow low salt diet and perform moderate exercise/walking at least 150 mins/week.  Please get fasting blood tests done within a week.  Please consider getting Shingrix vaccine at local pharmacy.

## 2024-03-29 NOTE — Assessment & Plan Note (Signed)
 Well-controlled Continue trazodone 150 mg nightly for now

## 2024-04-01 DIAGNOSIS — R7303 Prediabetes: Secondary | ICD-10-CM | POA: Diagnosis not present

## 2024-04-01 DIAGNOSIS — I739 Peripheral vascular disease, unspecified: Secondary | ICD-10-CM | POA: Diagnosis not present

## 2024-04-01 DIAGNOSIS — I10 Essential (primary) hypertension: Secondary | ICD-10-CM | POA: Diagnosis not present

## 2024-04-01 DIAGNOSIS — E782 Mixed hyperlipidemia: Secondary | ICD-10-CM | POA: Diagnosis not present

## 2024-04-01 DIAGNOSIS — E559 Vitamin D deficiency, unspecified: Secondary | ICD-10-CM | POA: Diagnosis not present

## 2024-04-03 LAB — CBC WITH DIFFERENTIAL/PLATELET
Basophils Absolute: 0.1 10*3/uL (ref 0.0–0.2)
Basos: 1 %
EOS (ABSOLUTE): 0.4 10*3/uL (ref 0.0–0.4)
Eos: 5 %
Hematocrit: 46 % (ref 37.5–51.0)
Hemoglobin: 15.5 g/dL (ref 13.0–17.7)
Immature Grans (Abs): 0 10*3/uL (ref 0.0–0.1)
Immature Granulocytes: 0 %
Lymphocytes Absolute: 2.2 10*3/uL (ref 0.7–3.1)
Lymphs: 26 %
MCH: 31.1 pg (ref 26.6–33.0)
MCHC: 33.7 g/dL (ref 31.5–35.7)
MCV: 92 fL (ref 79–97)
Monocytes Absolute: 0.6 10*3/uL (ref 0.1–0.9)
Monocytes: 7 %
Neutrophils Absolute: 5.2 10*3/uL (ref 1.4–7.0)
Neutrophils: 61 %
Platelets: 212 10*3/uL (ref 150–450)
RBC: 4.98 x10E6/uL (ref 4.14–5.80)
RDW: 12.6 % (ref 11.6–15.4)
WBC: 8.4 10*3/uL (ref 3.4–10.8)

## 2024-04-03 LAB — HEMOGLOBIN A1C
Est. average glucose Bld gHb Est-mCnc: 105 mg/dL
Hgb A1c MFr Bld: 5.3 % (ref 4.8–5.6)

## 2024-04-03 LAB — LIPID PANEL
Chol/HDL Ratio: 4.3 ratio (ref 0.0–5.0)
Cholesterol, Total: 170 mg/dL (ref 100–199)
HDL: 40 mg/dL (ref >39)
LDL Chol Calc (NIH): 81 mg/dL (ref 0–99)
Triglycerides: 298 mg/dL — ABNORMAL HIGH (ref 0–149)
VLDL Cholesterol Cal: 49 mg/dL — ABNORMAL HIGH (ref 5–40)

## 2024-04-03 LAB — CMP14+EGFR
ALT: 17 IU/L (ref 0–44)
AST: 20 IU/L (ref 0–40)
Albumin: 4.4 g/dL (ref 3.9–4.9)
Alkaline Phosphatase: 49 IU/L (ref 44–121)
BUN/Creatinine Ratio: 12 (ref 10–24)
BUN: 13 mg/dL (ref 8–27)
Bilirubin Total: 0.3 mg/dL (ref 0.0–1.2)
CO2: 22 mmol/L (ref 20–29)
Calcium: 9.8 mg/dL (ref 8.6–10.2)
Chloride: 102 mmol/L (ref 96–106)
Creatinine, Ser: 1.1 mg/dL (ref 0.76–1.27)
Globulin, Total: 2.9 g/dL (ref 1.5–4.5)
Glucose: 95 mg/dL (ref 70–99)
Potassium: 4.6 mmol/L (ref 3.5–5.2)
Sodium: 138 mmol/L (ref 134–144)
Total Protein: 7.3 g/dL (ref 6.0–8.5)
eGFR: 74 mL/min/{1.73_m2} (ref >59)

## 2024-04-03 LAB — TSH: TSH: 2.09 u[IU]/mL (ref 0.450–4.500)

## 2024-04-03 LAB — PSA: Prostate Specific Ag, Serum: 1.2 ng/mL (ref 0.0–4.0)

## 2024-04-03 LAB — VITAMIN D 25 HYDROXY (VIT D DEFICIENCY, FRACTURES): Vit D, 25-Hydroxy: 41.1 ng/mL (ref 30.0–100.0)

## 2024-04-05 ENCOUNTER — Ambulatory Visit: Payer: Self-pay | Admitting: Internal Medicine

## 2024-04-06 ENCOUNTER — Ambulatory Visit (HOSPITAL_COMMUNITY): Admission: RE | Admit: 2024-04-06 | Source: Ambulatory Visit

## 2024-04-15 ENCOUNTER — Ambulatory Visit (HOSPITAL_COMMUNITY)
Admission: RE | Admit: 2024-04-15 | Discharge: 2024-04-15 | Disposition: A | Discharge status: Home / Self Care | Source: Ambulatory Visit | Attending: Internal Medicine | Admitting: Internal Medicine

## 2024-04-15 DIAGNOSIS — E041 Nontoxic single thyroid nodule: Secondary | ICD-10-CM | POA: Diagnosis not present

## 2024-04-16 ENCOUNTER — Ambulatory Visit: Payer: Self-pay | Admitting: Internal Medicine

## 2024-07-11 ENCOUNTER — Other Ambulatory Visit: Payer: Self-pay | Admitting: Internal Medicine

## 2024-07-11 DIAGNOSIS — G4700 Insomnia, unspecified: Secondary | ICD-10-CM

## 2024-08-11 ENCOUNTER — Other Ambulatory Visit: Payer: Self-pay | Admitting: Internal Medicine

## 2024-08-11 DIAGNOSIS — I10 Essential (primary) hypertension: Secondary | ICD-10-CM

## 2024-08-19 ENCOUNTER — Ambulatory Visit
Admission: RE | Admit: 2024-08-19 | Discharge: 2024-08-19 | Disposition: A | Discharge status: Home / Self Care | Source: Ambulatory Visit | Attending: Nurse Practitioner | Admitting: Nurse Practitioner

## 2024-08-19 VITALS — BP 136/65 | HR 64 | Temp 98.4°F | Resp 16

## 2024-08-19 DIAGNOSIS — R059 Cough, unspecified: Secondary | ICD-10-CM

## 2024-08-19 DIAGNOSIS — J441 Chronic obstructive pulmonary disease with (acute) exacerbation: Secondary | ICD-10-CM

## 2024-08-19 DIAGNOSIS — J069 Acute upper respiratory infection, unspecified: Secondary | ICD-10-CM | POA: Diagnosis not present

## 2024-08-19 LAB — POC SOFIA SARS ANTIGEN FIA: SARS Coronavirus 2 Ag: NEGATIVE

## 2024-08-19 MED ORDER — GUAIFENESIN 100 MG/5ML PO LIQD: 10.0000 mL | Freq: Four times a day (QID) | ORAL | 0 refills | Status: AC | PRN

## 2024-08-19 MED ORDER — ALBUTEROL SULFATE HFA 108 (90 BASE) MCG/ACT IN AERS: 2.0000 | INHALATION_SPRAY | Freq: Four times a day (QID) | RESPIRATORY_TRACT | 0 refills | Status: DC | PRN

## 2024-08-19 MED ORDER — PREDNISONE 20 MG PO TABS: 40.0000 mg | ORAL_TABLET | Freq: Every day | ORAL | 0 refills | Status: AC

## 2024-08-19 MED ORDER — DEXAMETHASONE SOD PHOSPHATE PF 10 MG/ML IJ SOLN
10.0000 mg | Freq: Once | INTRAMUSCULAR | Status: AC
  Administered 2024-08-19: 10 mg via INTRAMUSCULAR

## 2024-08-19 NOTE — ED Triage Notes (Signed)
 Pt reports cough and congestion, since Saturday pt states he is having chest discomfort from cough.

## 2024-08-19 NOTE — Discharge Instructions (Addendum)
 The COVID test was negative. You were given an injection of Decadron  10 mg.  Start the prednisone  tomorrow. You may take over-the-counter Tylenol  as needed for pain, fever, or general discomfort. Increase fluids and allow for plenty of rest. Recommend use of a humidifier in your bedroom at nighttime during sleep and sleeping elevated on pillows while cough symptoms persist. Please consider smoking sensation.  You may follow-up with your primary care physician to discuss options. Go to the emergency department if you experience shortness of breath, difficulty breathing, chest pain, or other concerns. Follow-up with your primary care physician within the next 7 to 10 days for reevaluation, or sooner if symptoms fail to improve. Follow-up as needed.
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
## 2024-09-13 ENCOUNTER — Ambulatory Visit (INDEPENDENT_AMBULATORY_CARE_PROVIDER_SITE_OTHER): Payer: 59

## 2024-09-13 VITALS — Ht 73.0 in | Wt 175.0 lb

## 2024-09-13 DIAGNOSIS — Z1211 Encounter for screening for malignant neoplasm of colon: Secondary | ICD-10-CM

## 2024-09-13 DIAGNOSIS — Z0000 Encounter for general adult medical examination without abnormal findings: Secondary | ICD-10-CM
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
## 2024-09-13 NOTE — Patient Instructions (Signed)
 [name],  Thank you for taking the time for your Medicare Wellness Visit. I appreciate your continued commitment to your health goals. Please review the care plan we discussed, and feel free to reach out if I can assist you further.  Please note that Annual Wellness Visits do not include a physical exam. Some assessments may be limited, especially if the visit was conducted virtually. If needed, we may recommend an in-person follow-up with your provider.  Ongoing Care Seeing your primary care provider every 3 to 6 months helps us  monitor your health and provide consistent, personalized care.   Next office visit with primary care provider: September 28, 2024 at 3:00 pm in office  1 year follow up for Medicare well visit: September 14, 2025 at 1:10 pm with medicare wellness nurse in office  Referrals If a referral was made during today's visit and you haven't received any updates within two weeks, please contact the referred provider directly to check on the status.  None placed  Recommended Screenings:  Health Maintenance  Topic Date Due   Zoster (Shingles) Vaccine (1 of 2) Never done   COVID-19 Vaccine (3 - Moderna risk series) 05/08/2020   Flu Shot  05/07/2024   Medicare Annual Wellness Visit  09/09/2024   Screening for Lung Cancer  12/01/2024   Stool Blood Test  02/23/2025   Colon Cancer Screening  04/16/2025   DTaP/Tdap/Td vaccine (2 - Td or Tdap) 06/12/2028   Pneumococcal Vaccine for age over 81  Completed   Hepatitis C Screening  Completed   Meningitis B Vaccine  Aged Out       09/13/2024   12:56 PM  Advanced Directives  Does Patient Have a Medical Advance Directive? No  Would patient like information on creating a medical advance directive? Yes (MAU/Ambulatory/Procedural Areas - Information given)    Vision: Annual vision screenings are recommended for early detection of glaucoma, cataracts, and diabetic retinopathy. These exams can also reveal signs of chronic  conditions such as diabetes and high blood pressure.  Dental: Annual dental screenings help detect early signs of oral cancer, gum disease, and other conditions linked to overall health, including heart disease and diabetes.  Please see the attached documents for additional preventive care recommendations.

## 2024-09-28 ENCOUNTER — Encounter: Payer: Self-pay | Admitting: Internal Medicine

## 2024-09-28 ENCOUNTER — Ambulatory Visit: Admitting: Internal Medicine

## 2024-09-28 VITALS — BP 134/65 | HR 68 | Ht 73.0 in | Wt 182.0 lb

## 2024-09-28 DIAGNOSIS — R7303 Prediabetes: Secondary | ICD-10-CM | POA: Diagnosis not present

## 2024-09-28 DIAGNOSIS — Z23 Encounter for immunization: Secondary | ICD-10-CM

## 2024-09-28 DIAGNOSIS — E559 Vitamin D deficiency, unspecified: Secondary | ICD-10-CM | POA: Diagnosis not present

## 2024-09-28 DIAGNOSIS — Z125 Encounter for screening for malignant neoplasm of prostate: Secondary | ICD-10-CM | POA: Diagnosis not present

## 2024-09-28 DIAGNOSIS — K130 Diseases of lips: Secondary | ICD-10-CM

## 2024-09-28 DIAGNOSIS — J42 Unspecified chronic bronchitis: Secondary | ICD-10-CM

## 2024-09-28 DIAGNOSIS — E782 Mixed hyperlipidemia: Secondary | ICD-10-CM

## 2024-09-28 DIAGNOSIS — Z720 Tobacco use: Secondary | ICD-10-CM | POA: Diagnosis not present

## 2024-09-28 DIAGNOSIS — I10 Essential (primary) hypertension: Secondary | ICD-10-CM | POA: Diagnosis not present

## 2024-09-28 DIAGNOSIS — J449 Chronic obstructive pulmonary disease, unspecified: Secondary | ICD-10-CM | POA: Diagnosis not present

## 2024-09-28 MED ORDER — NICOTINE 21 MG/24HR TD PT24: 21.0000 mg | MEDICATED_PATCH | Freq: Every day | TRANSDERMAL | 2 refills | Status: AC

## 2024-09-28 MED ORDER — ALBUTEROL SULFATE HFA 108 (90 BASE) MCG/ACT IN AERS: 2.0000 | INHALATION_SPRAY | Freq: Four times a day (QID) | RESPIRATORY_TRACT | 5 refills | Status: AC | PRN

## 2024-09-28 MED ORDER — MUPIROCIN 2 % EX OINT: 1.0000 | TOPICAL_OINTMENT | Freq: Two times a day (BID) | CUTANEOUS | 1 refills | Status: AC

## 2024-09-28 MED ORDER — BREZTRI AEROSPHERE 160-9-4.8 MCG/ACT IN AERO: 2.0000 | INHALATION_SPRAY | Freq: Two times a day (BID) | RESPIRATORY_TRACT | 11 refills | Status: AC

## 2024-09-28 NOTE — Patient Instructions (Addendum)
 Please continue to take medications as prescribed.  Please continue to follow low salt diet and perform moderate exercise/walking at least 150 mins/week.  Please get fasting blood tests done before the next visit.  Okay to use Debrox ear drops (5 drops) as discussed for excess ear wax. Please use washcloth after applying Debrox ear drops.

## 2024-09-28 NOTE — Assessment & Plan Note (Addendum)
 On Crestor  5 mg once daily Checked lipid profile

## 2024-09-28 NOTE — Assessment & Plan Note (Signed)
 Lab Results  Component Value Date   HGBA1C 5.3 04/01/2024   Advised follow DASH diet for now

## 2024-09-28 NOTE — Assessment & Plan Note (Signed)
Smokes about 1.5 pack/day now  Asked about quitting: confirms that he currently smokes cigarettes Advise to quit smoking: Educated about QUITTING to reduce the risk of cancer, cardio and cerebrovascular disease. Assess willingness: Unwilling to quit at this time, but is working on cutting back. Assist with counseling and pharmacotherapy: Counseled for 5 minutes and literature provided.  Nicotine patch prescribed. Arrange for follow up: Follow up in 3 months and continue to offer help.

## 2024-09-28 NOTE — Assessment & Plan Note (Signed)
 BP Readings from Last 1 Encounters:  09/28/24 134/65   Well-controlled Continue Telmisartan  20 mg QD Counseled for compliance with the medications Advised DASH diet and moderate exercise/walking, at least 150 mins/week

## 2024-09-28 NOTE — Assessment & Plan Note (Addendum)
 Well-controlled Has Breztri  as maintenance inhaler, needs to use it regularly Albuterol  as needed for dyspnea or wheezing Discussed about difference of maintenance and rescue inhaler and appropriate use of them Needs to quit smoking

## 2024-09-28 NOTE — Assessment & Plan Note (Signed)
 Ordered PSA after discussing its limitations for prostate cancer screening, including false positive results leading to additional investigations.
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
## 2024-09-28 NOTE — Assessment & Plan Note (Signed)
Has recurrent lip fissures, likely due to angular cheilitis Had good response with mupirocin ointment, refilled

## 2024-10-15 ENCOUNTER — Other Ambulatory Visit: Payer: Self-pay

## 2024-10-15 DIAGNOSIS — Z122 Encounter for screening for malignant neoplasm of respiratory organs: Secondary | ICD-10-CM

## 2024-10-15 DIAGNOSIS — F17210 Nicotine dependence, cigarettes, uncomplicated: Secondary | ICD-10-CM

## 2024-10-15 DIAGNOSIS — Z87891 Personal history of nicotine dependence: Secondary | ICD-10-CM

## 2024-10-18 ENCOUNTER — Other Ambulatory Visit: Payer: Self-pay | Admitting: Internal Medicine

## 2024-10-18 ENCOUNTER — Ambulatory Visit: Payer: Self-pay | Admitting: Internal Medicine

## 2024-10-18 DIAGNOSIS — R195 Other fecal abnormalities: Secondary | ICD-10-CM

## 2024-10-18 LAB — COLOGUARD: COLOGUARD: POSITIVE — AB

## 2024-10-22 ENCOUNTER — Encounter: Payer: Self-pay | Admitting: Gastroenterology

## 2024-11-09 ENCOUNTER — Other Ambulatory Visit: Payer: Self-pay | Admitting: Internal Medicine

## 2024-11-09 DIAGNOSIS — G4700 Insomnia, unspecified: Secondary | ICD-10-CM

## 2024-11-23 ENCOUNTER — Ambulatory Visit: Admitting: Gastroenterology

## 2024-12-02 ENCOUNTER — Ambulatory Visit (HOSPITAL_COMMUNITY)

## 2025-03-29 ENCOUNTER — Ambulatory Visit: Payer: Self-pay | Admitting: Internal Medicine

## 2025-09-14 ENCOUNTER — Ambulatory Visit
